# Patient Record
Sex: Female | Born: 1960 | Race: White | Hispanic: No | Marital: Single | Smoking: Current every day smoker
Health system: Southern US, Community
[De-identification: ages and names within clinical notes are randomized; demographics above are authoritative.]

## PROBLEM LIST (undated history)

## (undated) DIAGNOSIS — R002 Palpitations: Secondary | ICD-10-CM

## (undated) DIAGNOSIS — F329 Major depressive disorder, single episode, unspecified: Secondary | ICD-10-CM

## (undated) DIAGNOSIS — L0291 Cutaneous abscess, unspecified: Secondary | ICD-10-CM

## (undated) DIAGNOSIS — F419 Anxiety disorder, unspecified: Secondary | ICD-10-CM

## (undated) DIAGNOSIS — F32A Depression, unspecified: Secondary | ICD-10-CM

## (undated) DIAGNOSIS — J449 Chronic obstructive pulmonary disease, unspecified: Secondary | ICD-10-CM

## (undated) HISTORY — PX: PARTIAL HYSTERECTOMY: SHX80

---

## 2006-02-28 ENCOUNTER — Other Ambulatory Visit: Payer: Self-pay

## 2006-03-01 ENCOUNTER — Inpatient Hospital Stay: Payer: Self-pay | Admitting: Internal Medicine

## 2006-07-06 ENCOUNTER — Emergency Department: Payer: Self-pay | Admitting: Internal Medicine

## 2006-09-15 ENCOUNTER — Emergency Department: Payer: Self-pay | Admitting: Emergency Medicine

## 2006-09-15 ENCOUNTER — Other Ambulatory Visit: Payer: Self-pay

## 2007-04-29 ENCOUNTER — Emergency Department: Payer: Self-pay | Admitting: Emergency Medicine

## 2007-06-03 ENCOUNTER — Emergency Department: Payer: Self-pay | Admitting: Emergency Medicine

## 2007-06-03 ENCOUNTER — Other Ambulatory Visit: Payer: Self-pay

## 2007-10-22 ENCOUNTER — Emergency Department: Payer: Self-pay | Admitting: Emergency Medicine

## 2007-10-24 ENCOUNTER — Emergency Department: Payer: Self-pay | Admitting: Emergency Medicine

## 2008-06-25 ENCOUNTER — Emergency Department: Payer: Self-pay | Admitting: Emergency Medicine

## 2009-11-30 ENCOUNTER — Emergency Department: Payer: Self-pay | Admitting: Emergency Medicine

## 2010-06-20 ENCOUNTER — Emergency Department: Payer: Self-pay | Admitting: Emergency Medicine

## 2011-07-06 ENCOUNTER — Emergency Department: Payer: Self-pay | Admitting: Emergency Medicine

## 2012-05-30 ENCOUNTER — Emergency Department: Payer: Self-pay | Admitting: Internal Medicine

## 2012-09-24 ENCOUNTER — Ambulatory Visit: Payer: Self-pay | Admitting: Internal Medicine

## 2013-08-03 ENCOUNTER — Emergency Department: Payer: Self-pay | Admitting: Emergency Medicine

## 2013-08-03 LAB — RAPID INFLUENZA A&B ANTIGENS

## 2013-10-13 ENCOUNTER — Emergency Department: Payer: Self-pay | Admitting: Emergency Medicine

## 2013-10-13 LAB — URINALYSIS, COMPLETE
BACTERIA: NONE SEEN
BILIRUBIN, UR: NEGATIVE
BLOOD: NEGATIVE
GLUCOSE, UR: NEGATIVE mg/dL (ref 0–75)
Ketone: NEGATIVE
LEUKOCYTE ESTERASE: NEGATIVE
Nitrite: NEGATIVE
Ph: 6 (ref 4.5–8.0)
Protein: NEGATIVE
SPECIFIC GRAVITY: 1.04 (ref 1.003–1.030)
Squamous Epithelial: 1
WBC UR: 1 /HPF (ref 0–5)

## 2013-10-13 LAB — BASIC METABOLIC PANEL
Anion Gap: 3 — ABNORMAL LOW (ref 7–16)
BUN: 8 mg/dL (ref 7–18)
CALCIUM: 9.1 mg/dL (ref 8.5–10.1)
CHLORIDE: 105 mmol/L (ref 98–107)
CO2: 29 mmol/L (ref 21–32)
CREATININE: 0.62 mg/dL (ref 0.60–1.30)
EGFR (African American): >60
GLUCOSE: 95 mg/dL (ref 65–99)
Osmolality: 272 (ref 275–301)
Potassium: 3.8 mmol/L (ref 3.5–5.1)
Sodium: 137 mmol/L (ref 136–145)

## 2013-10-13 LAB — CBC
HCT: 44.8 % (ref 35.0–47.0)
HGB: 14.6 g/dL (ref 12.0–16.0)
MCH: 28.7 pg (ref 26.0–34.0)
MCHC: 32.6 g/dL (ref 32.0–36.0)
MCV: 88 fL (ref 80–100)
PLATELETS: 231 10*3/uL (ref 150–440)
RBC: 5.08 10*6/uL (ref 3.80–5.20)
RDW: 14 % (ref 11.5–14.5)
WBC: 9.6 10*3/uL (ref 3.6–11.0)

## 2013-10-30 ENCOUNTER — Ambulatory Visit: Payer: Self-pay | Admitting: Internal Medicine

## 2014-07-20 ENCOUNTER — Emergency Department: Payer: Self-pay | Admitting: Emergency Medicine

## 2014-11-03 ENCOUNTER — Emergency Department: Payer: Self-pay | Admitting: Emergency Medicine

## 2014-11-03 LAB — BASIC METABOLIC PANEL
ANION GAP: 8 (ref 7–16)
BUN: 7 mg/dL
Calcium, Total: 9.7 mg/dL
Chloride: 102 mmol/L
Co2: 29 mmol/L
Creatinine: 0.52 mg/dL
EGFR (African American): >60
Glucose: 91 mg/dL
Potassium: 3.8 mmol/L
SODIUM: 139 mmol/L

## 2014-11-03 LAB — TROPONIN I: Troponin-I: <0.03 ng/mL

## 2014-11-03 LAB — CBC
HCT: 45.8 % (ref 35.0–47.0)
HGB: 15.1 g/dL (ref 12.0–16.0)
MCH: 29.4 pg (ref 26.0–34.0)
MCHC: 33 g/dL (ref 32.0–36.0)
MCV: 89 fL (ref 80–100)
PLATELETS: 230 10*3/uL (ref 150–440)
RBC: 5.15 10*6/uL (ref 3.80–5.20)
RDW: 14.3 % (ref 11.5–14.5)
WBC: 11.2 10*3/uL — AB (ref 3.6–11.0)

## 2015-08-24 ENCOUNTER — Emergency Department
Admission: EM | Admit: 2015-08-24 | Discharge: 2015-08-24 | Disposition: A | Discharge status: Home / Self Care | Payer: BLUE CROSS/BLUE SHIELD | Attending: Emergency Medicine | Admitting: Emergency Medicine

## 2015-08-24 ENCOUNTER — Encounter: Payer: Self-pay | Admitting: Emergency Medicine

## 2015-08-24 DIAGNOSIS — L0291 Cutaneous abscess, unspecified: Secondary | ICD-10-CM

## 2015-08-24 DIAGNOSIS — F17200 Nicotine dependence, unspecified, uncomplicated: Secondary | ICD-10-CM | POA: Diagnosis not present

## 2015-08-24 DIAGNOSIS — L02411 Cutaneous abscess of right axilla: Secondary | ICD-10-CM | POA: Diagnosis present

## 2015-08-24 HISTORY — DX: Cutaneous abscess, unspecified: L02.91

## 2015-08-24 MED ORDER — DOXYCYCLINE HYCLATE 100 MG PO TABS
ORAL_TABLET | ORAL | Status: AC
  Administered 2015-08-24: 100 mg via ORAL
  Filled 2015-08-24: qty 1

## 2015-08-24 MED ORDER — LIDOCAINE-EPINEPHRINE (PF) 1 %-1:200000 IJ SOLN
INTRAMUSCULAR | Status: AC
  Filled 2015-08-24: qty 30

## 2015-08-24 MED ORDER — HYDROCODONE-ACETAMINOPHEN 5-325 MG PO TABS
1.0000 | ORAL_TABLET | ORAL | Status: DC | PRN
Start: 2015-08-24 — End: 2018-04-05

## 2015-08-24 MED ORDER — DOXYCYCLINE HYCLATE 100 MG PO TABS
100.0000 mg | ORAL_TABLET | Freq: Once | ORAL | Status: AC
  Administered 2015-08-24: 100 mg via ORAL

## 2015-08-24 MED ORDER — ONDANSETRON 4 MG PO TBDP
4.0000 mg | ORAL_TABLET | Freq: Once | ORAL | Status: AC
  Administered 2015-08-24: 4 mg via ORAL

## 2015-08-24 MED ORDER — OXYCODONE-ACETAMINOPHEN 5-325 MG PO TABS
ORAL_TABLET | ORAL | Status: AC
  Administered 2015-08-24: 2 via ORAL
  Filled 2015-08-24: qty 2

## 2015-08-24 MED ORDER — ONDANSETRON 4 MG PO TBDP
ORAL_TABLET | ORAL | Status: AC
  Filled 2015-08-24: qty 1

## 2015-08-24 MED ORDER — DOXYCYCLINE HYCLATE 100 MG PO TABS: 100.0000 mg | ORAL_TABLET | Freq: Two times a day (BID) | ORAL | Status: DC

## 2015-08-24 MED ORDER — OXYCODONE-ACETAMINOPHEN 5-325 MG PO TABS
2.0000 | ORAL_TABLET | Freq: Once | ORAL | Status: AC
  Administered 2015-08-24: 2 via ORAL

## 2015-08-24 NOTE — ED Notes (Signed)
Abscess drained by md.  Pt tolerated well.   Bandage applied to right axilla.  Family with pt.  D/c inst to pt.

## 2015-08-24 NOTE — ED Provider Notes (Signed)
[facility] Emergency Department Provider Note  Time seen: 8:47 PM  I have reviewed the triage vital signs and the nursing notes.   HISTORY  Chief Complaint Abscess    HPI Taylor Larsen is a 55 y.o. female with a past medical history of abscesses presents to the emergency department with multiple abscesses to her right axilla. According to the patient she was developing abscesses, she took a course of Bactrim which completed approximately 2 weeks ago, but states since completion of the antibiotic she has continued to develop abscesses to her right axilla now they have become enlarged, red and painful. Denies fever, denies nausea and vomiting. States a history of several abscesses in the past. One of which was on the groin all the others have been in the axilla. Describes the pain as moderate, dull/aching.     Past Medical History  Diagnosis Date  . Abscess     There are no active problems to display for this patient.   Past Surgical History  Procedure Laterality Date  . Cesarean section    . Partial hysterectomy      No current outpatient prescriptions on file.  Allergies Review of patient's allergies indicates no known allergies.  History reviewed. No pertinent family history.  Social History Social History  Substance Use Topics  . Smoking status: Current Every Day Smoker  . Smokeless tobacco: None  . Alcohol Use: No    Review of Systems Constitutional: Negative for fever. Cardiovascular: Negative for chest pain. Respiratory: Negative for shortness of breath. Gastrointestinal: Negative for abdominal pain Musculoskeletal: Right axilla pain. Neurological: Negative for headache 10-point ROS otherwise negative.  ____________________________________________   PHYSICAL EXAM:  VITAL SIGNS: ED Triage Vitals  Enc Vitals Group     BP 08/24/15 1706 143/75 mmHg     Pulse Rate 08/24/15 1706 78     Resp 08/24/15 1706 16     Temp 08/24/15  1706 98.1 F (36.7 C)     Temp Source 08/24/15 1706 Oral     SpO2 08/24/15 1706 100 %     Weight 08/24/15 1706 85 lb (38.556 kg)     Height 08/24/15 1706 5' (1.524 m)     Head Cir --      Peak Flow --      Pain Score 08/24/15 1658 10     Pain Loc --      Pain Edu? --      Excl. in GC? --     Constitutional: Alert and oriented. Well appearing and in no distress. Eyes: Normal exam ENT   Head: Normocephalic and atraumatic   Mouth/Throat: Mucous membranes are moist. Cardiovascular: Normal rate, regular rhythm. No murmur Respiratory: Normal respiratory effort without tachypnea nor retractions. Breath sounds are clear Gastrointestinal: Soft and nontender. No distention.   Musculoskeletal: 3 areas tender and swollen in the right axilla consistent with abscess, one of which is spontaneously draining. Erythema and warmth to the area. Neurologic:  Normal speech and language. No gross focal neurologic deficits Skin:  Skin is warm, dry and intact.  Psychiatric: Mood and affect are normal. Speech and behavior are normal.   ____________________________________________    INITIAL IMPRESSION / ASSESSMENT AND PLAN / ED COURSE  Pertinent labs & imaging results that were available during my care of the patient were reviewed by me and considered in my medical decision making (see chart for details).  Patient with an exam consistent with several small abscesses to the right axilla, one of  which is spontaneously draining, 2 will likely require incision and drainage. No fever, no vomiting, otherwise uncomplicated. Given the patient's history of MRSA in the past and recent course of Bactrim without response we will place the patient on doxycycline after appropriate incision and drainage and have her follow up with her primary care physician.  INCISION AND DRAINAGE Performed by: Minna Antis Consent: Verbal consent obtained. Risks and benefits: risks, benefits and alternatives were  discussed Type: abscess  Body area: Right axilla  Anesthesia: local infiltration  Incision was made with a scalpel.  Local anesthetic: lidocaine 1 % with epinephrine  Anesthetic total: 3 ml  Complexity: complex Blunt dissection to break up loculations  Drainage: purulent  Drainage amount: 3-4 cc    Patient tolerance: Patient tolerated the procedure well with no immediate complications.  Incision and drainage completed. We'll place the patient on doxycycline twice a day for an extended never follow up with her primary care physician in 2 days for recheck.   ____________________________________________   FINAL CLINICAL IMPRESSION(S) / ED DIAGNOSES  Abscess to right axilla   Minna Antis, MD 08/24/15 2204

## 2015-08-24 NOTE — Discharge Instructions (Signed)
Please keep area covered with gauze. Please follow-up with [facility] clinic in 2 days for recheck/reevaluation. Return to the emergency department for any worsening pain, fever, or any other symptoms [place] concerning to yourself.   Abscess An abscess is an infected area that contains a collection of pus and debris.It can occur in almost any part of the body. An abscess is also known as a furuncle or boil. CAUSES  An abscess occurs when tissue gets infected. This can occur from blockage of oil or sweat glands, infection of hair follicles, or a minor injury to the skin. As the body tries to fight the infection, pus collects in the area and creates pressure under the skin. This pressure causes pain. People with weakened immune systems have difficulty fighting infections and get certain abscesses more often.  SYMPTOMS Usually an abscess develops on the skin and becomes a painful mass that is red, warm, and tender. If the abscess forms under the skin, you may feel a moveable soft area under the skin. Some abscesses break open (rupture) on their own, but most will continue to get worse without care. The infection can spread deeper into the body and eventually into the bloodstream, causing you to feel ill.  DIAGNOSIS  Your caregiver will take your medical history and perform a physical exam. A sample of fluid may also be taken from the abscess to determine what is causing your infection. TREATMENT  Your caregiver may prescribe antibiotic medicines to fight the infection. However, taking antibiotics alone usually does not cure an abscess. Your caregiver may need to make a small cut (incision) in the abscess to drain the pus. In some cases, gauze is packed into the abscess to reduce pain and to continue draining the area. HOME CARE INSTRUCTIONS   Only take over-the-counter or prescription medicines for pain, discomfort, or fever as directed by your caregiver.  If you were prescribed antibiotics, take them  as directed. Finish them even if you start to feel better.  If gauze is used, follow your caregiver's directions for changing the gauze.  To avoid spreading the infection:  Keep your draining abscess covered with a bandage.  Wash your hands well.  Do not share personal care items, towels, or whirlpools with others.  Avoid skin contact with others.  Keep your skin and clothes clean around the abscess.  Keep all follow-up appointments as directed by your caregiver. SEEK MEDICAL CARE IF:   You have increased pain, swelling, redness, fluid drainage, or bleeding.  You have muscle aches, chills, or a general ill feeling.  You have a fever. MAKE SURE YOU:   Understand these instructions.  Will watch your condition.  Will get help right away if you are not doing well or get worse.   This information is not intended to replace advice given to you by your health care provider. Make sure you discuss any questions you have with your health care provider.   Document Released: 05/03/2005 Document Revised: 01/23/2012 Document Reviewed: 10/06/2011 Elsevier Interactive Patient Education 2016 [employer]  Incision and Drainage Incision and drainage is a procedure in which a sac-like structure (cystic structure) is opened and drained. The area to be drained usually contains material such as pus, fluid, or blood.  LET YOUR CAREGIVER KNOW ABOUT:   Allergies to medicine.  Medicines taken, including vitamins, herbs, eyedrops, over-the-counter medicines, and creams.  Use of steroids (by mouth or creams).  Previous problems with anesthetics or numbing medicines.  History of bleeding problems or  blood clots.  Previous surgery.  Other health problems, including diabetes and kidney problems.  Possibility of pregnancy, if this applies. RISKS AND COMPLICATIONS  Pain.  Bleeding.  Scarring.  Infection. BEFORE THE PROCEDURE  You may need to have an ultrasound or other imaging  tests to see how large or deep your cystic structure is. Blood tests may also be used to determine if you have an infection or how severe the infection is. You may need to have a tetanus shot. PROCEDURE  The affected area is cleaned with a cleaning fluid. The cyst area will then be numbed with a medicine (local anesthetic). A small incision will be made in the cystic structure. A syringe or catheter may be used to drain the contents of the cystic structure, or the contents may be squeezed out. The area will then be flushed with a cleansing solution. After cleansing the area, it is often gently packed with a gauze or another wound dressing. Once it is packed, it will be covered with gauze and tape or some other type of wound dressing. AFTER THE PROCEDURE   Often, you will be allowed to go home right after the procedure.  You may be given antibiotic medicine to prevent or heal an infection.  If the area was packed with gauze or some other wound dressing, you will likely need to come back in 1 to 2 days to get it removed.  The area should heal in about 14 days.   This information is not intended to replace advice given to you by your health care provider. Make sure you discuss any questions you have with your health care provider.   Document Released: 01/17/2001 Document Revised: 01/23/2012 Document Reviewed: 09/18/2011 Elsevier Interactive Patient Education [employer]

## 2015-08-24 NOTE — ED Notes (Signed)
Pt has MRSA and 2 weeks ago just finished abx for abscess.  Multiple abscess to right axilla.  Has had fevers at home.

## 2015-11-28 ENCOUNTER — Observation Stay
Admission: EM | Admit: 2015-11-28 | Discharge: 2015-11-30 | Disposition: A | Discharge status: Home / Self Care | Payer: BLUE CROSS/BLUE SHIELD | Attending: Specialist | Admitting: Specialist

## 2015-11-28 ENCOUNTER — Encounter: Payer: Self-pay | Admitting: Internal Medicine

## 2015-11-28 ENCOUNTER — Emergency Department: Payer: BLUE CROSS/BLUE SHIELD

## 2015-11-28 DIAGNOSIS — Z8249 Family history of ischemic heart disease and other diseases of the circulatory system: Secondary | ICD-10-CM | POA: Diagnosis not present

## 2015-11-28 DIAGNOSIS — J449 Chronic obstructive pulmonary disease, unspecified: Secondary | ICD-10-CM | POA: Insufficient documentation

## 2015-11-28 DIAGNOSIS — A4902 Methicillin resistant Staphylococcus aureus infection, unspecified site: Secondary | ICD-10-CM | POA: Insufficient documentation

## 2015-11-28 DIAGNOSIS — M7989 Other specified soft tissue disorders: Secondary | ICD-10-CM | POA: Insufficient documentation

## 2015-11-28 DIAGNOSIS — I10 Essential (primary) hypertension: Secondary | ICD-10-CM | POA: Diagnosis not present

## 2015-11-28 DIAGNOSIS — K219 Gastro-esophageal reflux disease without esophagitis: Secondary | ICD-10-CM | POA: Insufficient documentation

## 2015-11-28 DIAGNOSIS — L03114 Cellulitis of left upper limb: Secondary | ICD-10-CM | POA: Diagnosis present

## 2015-11-28 DIAGNOSIS — Z8614 Personal history of Methicillin resistant Staphylococcus aureus infection: Secondary | ICD-10-CM | POA: Insufficient documentation

## 2015-11-28 DIAGNOSIS — F17200 Nicotine dependence, unspecified, uncomplicated: Secondary | ICD-10-CM | POA: Insufficient documentation

## 2015-11-28 DIAGNOSIS — F329 Major depressive disorder, single episode, unspecified: Secondary | ICD-10-CM | POA: Diagnosis not present

## 2015-11-28 DIAGNOSIS — Z800 Family history of malignant neoplasm of digestive organs: Secondary | ICD-10-CM | POA: Insufficient documentation

## 2015-11-28 DIAGNOSIS — T63301A Toxic effect of unspecified spider venom, accidental (unintentional), initial encounter: Secondary | ICD-10-CM | POA: Insufficient documentation

## 2015-11-28 DIAGNOSIS — M79642 Pain in left hand: Secondary | ICD-10-CM | POA: Diagnosis not present

## 2015-11-28 DIAGNOSIS — R002 Palpitations: Secondary | ICD-10-CM | POA: Diagnosis not present

## 2015-11-28 DIAGNOSIS — F411 Generalized anxiety disorder: Secondary | ICD-10-CM | POA: Insufficient documentation

## 2015-11-28 DIAGNOSIS — Z90710 Acquired absence of both cervix and uterus: Secondary | ICD-10-CM | POA: Diagnosis not present

## 2015-11-28 DIAGNOSIS — L0390 Cellulitis, unspecified: Secondary | ICD-10-CM

## 2015-11-28 HISTORY — DX: Palpitations: R00.2

## 2015-11-28 HISTORY — DX: Major depressive disorder, single episode, unspecified: F32.9

## 2015-11-28 HISTORY — DX: Depression, unspecified: F32.A

## 2015-11-28 HISTORY — DX: Anxiety disorder, unspecified: F41.9

## 2015-11-28 HISTORY — DX: Chronic obstructive pulmonary disease, unspecified: J44.9

## 2015-11-28 LAB — CBC WITH DIFFERENTIAL/PLATELET
Basophils Absolute: 0 10*3/uL (ref 0–0.1)
Basophils Relative: 0 %
Eosinophils Absolute: 0.1 10*3/uL (ref 0–0.7)
Eosinophils Relative: 0 %
HEMATOCRIT: 42.6 % (ref 35.0–47.0)
HEMOGLOBIN: 14.6 g/dL (ref 12.0–16.0)
LYMPHS ABS: 1.5 10*3/uL (ref 1.0–3.6)
LYMPHS PCT: 10 %
MCH: 28.9 pg (ref 26.0–34.0)
MCHC: 34.1 g/dL (ref 32.0–36.0)
MCV: 84.6 fL (ref 80.0–100.0)
MONOS PCT: 6 %
Monocytes Absolute: 0.8 10*3/uL (ref 0.2–0.9)
Neutro Abs: 12.8 10*3/uL — ABNORMAL HIGH (ref 1.4–6.5)
Neutrophils Relative %: 84 %
Platelets: 237 10*3/uL (ref 150–440)
RBC: 5.04 MIL/uL (ref 3.80–5.20)
RDW: 14.4 % (ref 11.5–14.5)
WBC: 15.2 10*3/uL — AB (ref 3.6–11.0)

## 2015-11-28 LAB — BASIC METABOLIC PANEL
Anion gap: 9 (ref 5–15)
BUN: 8 mg/dL (ref 6–20)
CHLORIDE: 106 mmol/L (ref 101–111)
CO2: 25 mmol/L (ref 22–32)
Calcium: 9.4 mg/dL (ref 8.9–10.3)
Creatinine, Ser: 0.6 mg/dL (ref 0.44–1.00)
GFR calc Af Amer: >60 mL/min (ref >60)
GFR calc non Af Amer: >60 mL/min (ref >60)
GLUCOSE: 121 mg/dL — AB (ref 65–99)
POTASSIUM: 3.9 mmol/L (ref 3.5–5.1)
Sodium: 140 mmol/L (ref 135–145)

## 2015-11-28 LAB — MRSA PCR SCREENING: MRSA BY PCR: NEGATIVE

## 2015-11-28 LAB — SEDIMENTATION RATE: SED RATE: 20 mm/h (ref 0–30)

## 2015-11-28 MED ORDER — MORPHINE SULFATE (PF) 2 MG/ML IV SOLN
2.0000 mg | INTRAVENOUS | Status: DC | PRN
  Administered 2015-11-28 – 2015-11-30 (×5): 2 mg via INTRAVENOUS
  Filled 2015-11-28 (×5): qty 1

## 2015-11-28 MED ORDER — ACETAMINOPHEN 650 MG RE SUPP: 650.0000 mg | Freq: Four times a day (QID) | RECTAL | Status: DC | PRN

## 2015-11-28 MED ORDER — ALPRAZOLAM 0.25 MG PO TABS
0.2500 mg | ORAL_TABLET | ORAL | Status: DC | PRN
  Administered 2015-11-28 – 2015-11-29 (×6): 0.25 mg via ORAL
  Filled 2015-11-28 (×6): qty 1

## 2015-11-28 MED ORDER — VANCOMYCIN HCL 500 MG IV SOLR
500.0000 mg | Freq: Two times a day (BID) | INTRAVENOUS | Status: DC
  Administered 2015-11-29 (×3): 500 mg via INTRAVENOUS
  Filled 2015-11-28 (×6): qty 500

## 2015-11-28 MED ORDER — ACETAMINOPHEN 325 MG PO TABS: 650.0000 mg | ORAL_TABLET | Freq: Four times a day (QID) | ORAL | Status: DC | PRN

## 2015-11-28 MED ORDER — PIPERACILLIN-TAZOBACTAM 3.375 G IVPB
3.3750 g | Freq: Three times a day (TID) | INTRAVENOUS | Status: DC
  Administered 2015-11-28 – 2015-11-30 (×6): 3.375 g via INTRAVENOUS
  Filled 2015-11-28 (×8): qty 50

## 2015-11-28 MED ORDER — BISACODYL 10 MG RE SUPP: 10.0000 mg | Freq: Every day | RECTAL | Status: DC | PRN

## 2015-11-28 MED ORDER — ONDANSETRON HCL 4 MG PO TABS: 4.0000 mg | ORAL_TABLET | Freq: Four times a day (QID) | ORAL | Status: DC | PRN

## 2015-11-28 MED ORDER — PANTOPRAZOLE SODIUM 40 MG PO TBEC
40.0000 mg | DELAYED_RELEASE_TABLET | Freq: Every day | ORAL | Status: DC
  Administered 2015-11-28 – 2015-11-30 (×3): 40 mg via ORAL
  Filled 2015-11-28 (×3): qty 1

## 2015-11-28 MED ORDER — SODIUM CHLORIDE 0.9 % IV SOLN
INTRAVENOUS | Status: DC
  Administered 2015-11-28 (×2): via INTRAVENOUS

## 2015-11-28 MED ORDER — DOCUSATE SODIUM 100 MG PO CAPS
100.0000 mg | ORAL_CAPSULE | Freq: Two times a day (BID) | ORAL | Status: DC
  Administered 2015-11-28 – 2015-11-30 (×4): 100 mg via ORAL
  Filled 2015-11-28 (×4): qty 1

## 2015-11-28 MED ORDER — HEPARIN SODIUM (PORCINE) 5000 UNIT/ML IJ SOLN
5000.0000 [IU] | Freq: Three times a day (TID) | INTRAMUSCULAR | Status: DC
  Administered 2015-11-28 – 2015-11-29 (×2): 5000 [IU] via SUBCUTANEOUS
  Filled 2015-11-28 (×2): qty 1

## 2015-11-28 MED ORDER — HYDROCODONE-ACETAMINOPHEN 5-325 MG PO TABS
1.0000 | ORAL_TABLET | ORAL | Status: DC | PRN
  Administered 2015-11-28 – 2015-11-30 (×8): 1 via ORAL
  Filled 2015-11-28 (×8): qty 1

## 2015-11-28 MED ORDER — LORAZEPAM 2 MG/ML IJ SOLN
1.0000 mg | Freq: Once | INTRAMUSCULAR | Status: AC
  Administered 2015-11-28: 1 mg via INTRAVENOUS
  Filled 2015-11-28: qty 1

## 2015-11-28 MED ORDER — VANCOMYCIN HCL IN DEXTROSE 1-5 GM/200ML-% IV SOLN
1000.0000 mg | Freq: Once | INTRAVENOUS | Status: AC
  Administered 2015-11-28: 1000 mg via INTRAVENOUS
  Filled 2015-11-28: qty 200

## 2015-11-28 MED ORDER — ONDANSETRON HCL 4 MG/2ML IJ SOLN: 4.0000 mg | Freq: Four times a day (QID) | INTRAMUSCULAR | Status: DC | PRN

## 2015-11-28 NOTE — Consult Note (Signed)
ORTHOPAEDIC CONSULTATION  REQUESTING PHYSICIAN: Marguarite ArbourJeffrey D Sparks, MD  Chief Complaint:   Left dorsal hand pain, erythema, and swelling.  History of Present Illness: Taylor Larsen is a 55 y.o. female with a history of multiple skin abscesses, COPD, anxiety, and heart palpitations who was doing some yard work yesterday when she felt a sharp stinging on the dorsal aspect of her left index finger which she attributes to a spider bite. She awoke this morning with significant pain in the dorsal aspect of the index finger and hand with increased swelling and erythema extending into the dorsal hand region. The patient notes that her son had developed cellulitis of his right index finger several years ago resulting in an amputation of his finger, which has her quite concerned. The patient also has a history of multiple subcutaneous abscesses in her axilla and groin regions which she treats with heat compresses. She has been diagnosed with MRSA in the past.  Past Medical History  Diagnosis Date  . Abscess   . COPD (chronic obstructive pulmonary disease) (HCC)   . Anxiety   . Heart palpitations    Past Surgical History  Procedure Laterality Date  . Cesarean section    . Partial hysterectomy     Social History   Social History  . Marital Status: Single    Spouse Name: N/A  . Number of Children: N/A  . Years of Education: N/A   Social History Main Topics  . Smoking status: Current Every Day Smoker  . Smokeless tobacco: None  . Alcohol Use: No  . Drug Use: No  . Sexual Activity: Not Asked   Other Topics Concern  . None   Social History Narrative   Family History  Problem Relation Age of Onset  . CAD Mother   . Rectal cancer Mother   . CAD Father   . CAD Sister    No Known Allergies Prior to Admission medications   Medication Sig Start Date End Date Taking? Authorizing Provider  doxycycline (VIBRA-TABS) 100 MG tablet  Take 1 tablet (100 mg total) by mouth 2 (two) times daily. 08/24/15   Minna AntisKevin Paduchowski, MD  HYDROcodone-acetaminophen (NORCO/VICODIN) 5-325 MG tablet Take 1 tablet by mouth every 4 (four) hours as needed for moderate pain. 08/24/15   Minna AntisKevin Paduchowski, MD   Dg Hand 2 View Left  11/28/2015  CLINICAL DATA:  Pt reports feeling a sting when doing yard work a few days ago, blister noted to left dorsal hand, pt presents to ED today with redness and swelling, concern for cellulitis. EXAM: LEFT HAND - 2 VIEW COMPARISON:  None. FINDINGS: No evidence of fracture of the carpal or metacarpal bones. Radiocarpal joint is intact. Phalanges are normal. No soft tissue injury. No foreign body IMPRESSION: No fracture or  foreign body. Electronically Signed   By: Genevive BiStewart  Edmunds M.D.   On: 11/28/2015 11:45    Positive ROS: All other systems have been reviewed and were otherwise negative with the exception of those mentioned in the HPI and as above.  Physical Exam: General:  Alert, no acute distress Psychiatric:  Patient is competent for consent with normal mood and affect   Cardiovascular:  No pedal edema Respiratory:  No wheezing, non-labored breathing GI:  Abdomen is soft and non-tender Skin:  No lesions in the area of chief complaint Neurologic:  Sensation intact distally Lymphatic:  No axillary or cervical lymphadenopathy  Orthopedic Exam:  Orthopedic examination is limited to the left hand and forearm. There is a  1.2-1.5 cm area of blistering/superficial necrosis over the dorsal aspect of the left index finger proximal phalanx midway between the MCP and PIP joints. She has extensive swelling and erythema on the dorsal aspect of her hand extending from the index PIP joint distally to the [facility] joint region of her dorsal radial hand proximally. She has moderate tenderness to palpation throughout this area as well. Palmarly, there is no erythema and only mild swelling, especially along the flexor sheath. She does  have diffuse tenderness to palpation over both the dorsal and volar aspects of the hand, and complains of significant pain with any attempted active or passive motion of the index finger. However, it is difficult to determine how much of this is due to apprehension and pain from a dorsal hand and finger source versus an actual flexor tenosynovitis. She is neurovascularly intact to all digits. She has full active and passive range of motion of the wrist. There are no skin abnormalities over the wrist or forearm.  X-rays:  X-rays of the left hand are available for review. These films demonstrate no evidence for fractures, lytic lesions, significant degenerative changes, or any retained foreign bodies.  Assessment: Left dorsal hand cellulitis status post spider bite to the dorsal aspect of left index finger.  Plan: I agree with the plan to unroof the area of blistering where the spider bite occurred on the dorsal aspect of the index finger. I also agree with the plan to admit the patient for close observation and IV antibiotics. Given her history of MRSA, I do feel that vancomycin is an appropriate choice of antibiotics. At this point, I do not feel that she has a flexor tenosynovitis, but do feel that close follow-up is warranted to be sure that she does not develop this. If so, she will need to be taken to the operating room for formal irrigation and debridement.  Thank you for asking me to participate in the care of this pleasant unfortunate woman. I will be happy to follow her with you.   Maryagnes Amos, MD  Beeper #:  [phone]  11/28/2015 3:12 PM

## 2015-11-28 NOTE — H&P (Signed)
History and Physical    Taylor Larsen JYN:[id number] DOB: 04/09/1961 DOA: 11/28/2015  Referring physician: Dr. Pershing ProudSchaevitz PCP: No PCP Per Patient  Specialists: none  Chief Complaint: left hand pain  HPI: Taylor Larsen is a 55 y.o. female has a past medical history significant for COPD/tobacco use and anxiety now with acute left hand pain with swelling and redness. Noted a "bite" to left hand/finger yesterday which has progressively become more swollen, red, and tender. Presented to ER where WBC=15K. Left hand severely tender and red with some purulence to 2nd finger. She is afebrile. No N/V/D. Denies CP or SOB. She is now admitted.  Review of Systems: The patient denies anorexia, fever, weight loss,, vision loss, decreased hearing, hoarseness, chest pain, syncope, dyspnea on exertion, peripheral edema, balance deficits, hemoptysis, abdominal pain, melena, hematochezia, severe indigestion/heartburn, hematuria, incontinence, genital sores, muscle weakness, transient blindness, difficulty walking, depression, unusual weight change, abnormal bleeding, enlarged lymph nodes, angioedema, and breast masses.   Past Medical History  Diagnosis Date  . Abscess   . COPD (chronic obstructive pulmonary disease) (HCC)   . Anxiety   . Heart palpitations    Past Surgical History  Procedure Laterality Date  . Cesarean section    . Partial hysterectomy     Social History:  reports that she has been smoking.  She does not have any smokeless tobacco history on file. She reports that she does not drink alcohol or use illicit drugs.  No Known Allergies  Family History  Problem Relation Age of Onset  . CAD Mother   . Rectal cancer Mother   . CAD Father   . CAD Sister     Prior to Admission medications   Medication Sig Start Date End Date Taking? Authorizing Provider  doxycycline (VIBRA-TABS) 100 MG tablet Take 1 tablet (100 mg total) by mouth 2 (two) times daily. 08/24/15   Minna AntisKevin Paduchowski, MD   HYDROcodone-acetaminophen (NORCO/VICODIN) 5-325 MG tablet Take 1 tablet by mouth every 4 (four) hours as needed for moderate pain. 08/24/15   Minna AntisKevin Paduchowski, MD   Physical Exam: Filed Vitals:   11/28/15 1015  BP: 161/70  Pulse: 81  Temp: 97.4 F (36.3 C)  TempSrc: Oral  Resp: 16  Height: 5' (1.524 m)  Weight: 44.906 kg (99 lb)  SpO2: 100%     General:  No apparent distress, WDWN, [place]/AT  Eyes: PERRL, EOMI, no scleral icterus, conjunctiva clear  ENT: moist oropharynx without exudates, TM's benign, dentition fair  Neck: supple, no lymphadenopathy. No bruits or thyromegaly  Cardiovascular: regular rate without MRG; 2+ peripheral pulses, no JVD, no peripheral edema  Respiratory: CTA biL, good air movement without wheezing, rhonchi or crackled, respiratory effort normal  Abdomen: soft, non tender to palpation, positive bowel sounds, no guarding, no rebound, no organomegaly  Skin: 3/4 of left hand is swollen and red with tenderness to palpation. 1cm purulent area noted to left 2nd finger.  Musculoskeletal: normal bulk and tone, no joint swelling  Psychiatric: normal mood and affect, A&OX3  Neurologic: CN 2-12 grossly intact, Motor strength 5/5 in all 4 groups with symmetric DTR's and non-focal sensory exam  Labs on Admission:  Basic Metabolic Panel:  Recent Labs Lab 11/28/15 1142  NA 140  K 3.9  CL 106  CO2 25  GLUCOSE 121*  BUN 8  CREATININE 0.60  CALCIUM 9.4   Liver Function Tests: No results for input(s): AST, ALT, ALKPHOS, BILITOT, PROT, ALBUMIN in the last 168 hours. No results for  input(s): LIPASE, AMYLASE in the last 168 hours. No results for input(s): AMMONIA in the last 168 hours. CBC:  Recent Labs Lab 11/28/15 1142  WBC 15.2*  NEUTROABS 12.8*  HGB 14.6  HCT 42.6  MCV 84.6  PLT 237   Cardiac Enzymes: No results for input(s): CKTOTAL, CKMB, CKMBINDEX, TROPONINI in the last 168 hours.  BNP (last 3 results) No results for input(s): BNP in  the last 8760 hours.  ProBNP (last 3 results) No results for input(s): PROBNP in the last 8760 hours.  CBG: No results for input(s): GLUCAP in the last 168 hours.  Radiological Exams on Admission: Dg Hand 2 View Left  11/28/2015  CLINICAL DATA:  Pt reports feeling a sting when doing yard work a few days ago, blister noted to left dorsal hand, pt presents to ED today with redness and swelling, concern for cellulitis. EXAM: LEFT HAND - 2 VIEW COMPARISON:  None. FINDINGS: No evidence of fracture of the carpal or metacarpal bones. Radiocarpal joint is intact. Phalanges are normal. No soft tissue injury. No foreign body IMPRESSION: No fracture or  foreign body. Electronically Signed   By: Genevive Bi M.D.   On: 11/28/2015 11:45    EKG: Independently reviewed.  Assessment/Plan Principal Problem:   Cellulitis Active Problems:   Left hand pain   HTN (hypertension)   GAD (generalized anxiety disorder)   Will observe on floor with IV fluids and IV ABX. Cultures sent. Consult Ortho. Xanax as needed. Pt defers nicotine patch. Repeat labs in AM  Diet: low salt Fluids: NS@100  DVT Prophylaxis: SQ Heparin  Code Status: FULL  Family Communication: yes  Disposition Plan: home  Time spent: 50 min

## 2015-11-28 NOTE — Progress Notes (Signed)
Pharmacy Antibiotic Note  Taylor Larsen is a 55 y.o. female admitted on 11/28/2015 with cellulitis.  Pharmacy has been consulted for vancomycin dosing. Patient also ordered Zosyn.   Plan: Vancomycin 1000 mg iv once given in ED. Will continue with vancomycin 500 IV every 12 hours and order a trough with the 5th dose.  Goal trough 15-20 mcg/mL until r/o abscess/osteo.  Height: 5' (152.4 cm) Weight: 99 lb (44.906 kg) IBW/kg (Calculated) : 45.5  Temp (24hrs), Avg:97.4 F (36.3 C), Min:97.4 F (36.3 C), Max:97.4 F (36.3 C)   Recent Labs Lab 11/28/15 1142  WBC 15.2*  CREATININE 0.60    Estimated Creatinine Clearance: 58.3 mL/min (by C-G formula based on Cr of 0.6).    No Known Allergies  Antimicrobials this admission: vancomycin 4/23 >>  Zosyn 4/23 >>   Dose adjustments this admission:   Microbiology results: 4/23 BCx: pending Wound cx: pending  Thank you for allowing pharmacy to be a part of this patient's care.  Valentina GuChristy, Emeric Novinger D 11/28/2015 2:31 PM

## 2015-11-28 NOTE — ED Provider Notes (Signed)
[facility] Emergency Department Provider Note  ____________________________________________  Time seen: Approximately 11:10 AM  I have reviewed the triage vital signs and the nursing notes.   HISTORY  Chief Complaint Cellulitis   HPI Taylor Larsen is a 55 y.o. female with a history of MRSA was presenting today with 1 day of left index finger swelling and pain. She says that she thinks she had a bug bite several days ago and then yesterday the swelling began. She is also noticed some pus in the dorsum of her left index finger. It is painful with movement. She did not witness any bug biting her but thinks that is the cause of the initial lesion. She says that the initial lesion on the dorsum of her left index finger was itchy and blood filled. She said that the blood drained and was replaced with pus. Denies any fever.   Past Medical History  Diagnosis Date  . Abscess     There are no active problems to display for this patient.   Past Surgical History  Procedure Laterality Date  . Cesarean section    . Partial hysterectomy      Current Outpatient Rx  Name  Route  Sig  Dispense  Refill  . doxycycline (VIBRA-TABS) 100 MG tablet   Oral   Take 1 tablet (100 mg total) by mouth 2 (two) times daily.   20 tablet   0   . HYDROcodone-acetaminophen (NORCO/VICODIN) 5-325 MG tablet   Oral   Take 1 tablet by mouth every 4 (four) hours as needed for moderate pain.   10 tablet   0     Allergies Review of patient's allergies indicates no known allergies.  No family history on file.  Social History Social History  Substance Use Topics  . Smoking status: Current Every Day Smoker  . Smokeless tobacco: Not on file  . Alcohol Use: No    Review of Systems Constitutional: No fever/chills Eyes: No visual changes. ENT: No sore throat. Cardiovascular: Denies chest pain. Respiratory: Denies shortness of breath. Gastrointestinal: No abdominal pain.  No  nausea, no vomiting.  No diarrhea.  No constipation. Genitourinary: Negative for dysuria. Musculoskeletal: Negative for back pain. Skin: Negative for rash. Neurological: Negative for headaches, focal weakness or numbness.  10-point ROS otherwise negative.  ____________________________________________   PHYSICAL EXAM:  VITAL SIGNS: ED Triage Vitals  Enc Vitals Group     BP 11/28/15 1015 161/70 mmHg     Pulse Rate 11/28/15 1015 81     Resp 11/28/15 1015 16     Temp 11/28/15 1015 97.4 F (36.3 C)     Temp Source 11/28/15 1015 Oral     SpO2 11/28/15 1015 100 %     Weight 11/28/15 1015 99 lb (44.906 kg)     Height 11/28/15 1015 5' (1.524 m)     Head Cir --      Peak Flow --      Pain Score 11/28/15 1021 8     Pain Loc --      Pain Edu? --      Excl. in GC? --     Constitutional: Alert and oriented. Well appearing and in no acute distress. Eyes: Conjunctivae are normal. PERRL. EOMI. Head: Atraumatic. Nose: No congestion/rhinnorhea. Mouth/Throat: Mucous membranes are moist.   Neck: No stridor.   Cardiovascular: Normal rate, regular rhythm. Grossly normal heart sounds.  Respiratory: Normal respiratory effort.  No retractions. Lungs CTAB. Gastrointestinal: Soft and nontender. No distention.  Musculoskeletal: Left hand with erythema over the dorsum covering about one third of the dorsum extending from the index finger. There is moderate tenderness over the erythematous area but without any induration or pus. The left index fingers concentrically swollen with tenderness on passive extension as well as with palpation over the flexor tendons. Over the dorsum of the proximal segment there is a 1 cm, pus filled lesion which is tender to palpation. It is not actively draining. Neurologic:  Normal speech and language. No gross focal neurologic deficits are appreciated. No gait instability. Skin:  Skin is warm, dry and intact. No rash noted. Psychiatric: Mood and affect are normal. Speech  and behavior are normal.  ____________________________________________   LABS (all labs ordered are listed, but only abnormal results are displayed)  Labs Reviewed  CBC WITH DIFFERENTIAL/PLATELET - Abnormal; Notable for the following:    WBC 15.2 (*)    Neutro Abs 12.8 (*)    All other components within normal limits  BASIC METABOLIC PANEL - Abnormal; Notable for the following:    Glucose, Bld 121 (*)    All other components within normal limits  SEDIMENTATION RATE   ____________________________________________  EKG   ____________________________________________  RADIOLOGY   IMPRESSION: No fracture or foreign body.   Electronically Signed By: Genevive BiStewart Edmunds M.D. On: 11/28/2015 11:45 ____________________________________________   PROCEDURES   ____________________________________________   INITIAL IMPRESSION / ASSESSMENT AND PLAN / ED COURSE  Pertinent labs & imaging results that were available during my care of the patient were reviewed by me and considered in my medical decision making (see chart for details).  ----------------------------------------- 11:18 AM on 11/28/2015 -----------------------------------------  Discussed case with Dr. Joice LoftsPoggi of orthopedics who shares my concern for flexor tenosynovitis. Recommends transfer to [facility].  ----------------------------------------- 11:47 AM on 11/28/2015 -----------------------------------------  [facility] Dr. Mina MarbleWeingold, who says that there is no need for tertiary care center at this time. He recommends that Dr. Joice LoftsPoggi see the patient here in [place]. I discussed this with Dr. Joice LoftsPoggi and he will be down to see the patient.  ----------------------------------------- 12:48 PM on 11/28/2015 -----------------------------------------  Evaluated the bedside by Dr. Joice LoftsPoggi does not think the patient has flexor tenosynovitis at this time. However, he does recommend  admission for IV antibiotics. We'll admit to the hospital. Signed out to Dr. Judithann SheenSparks. Patient with the plan and willing to comply. ____________________________________________   FINAL CLINICAL IMPRESSION(S) / ED DIAGNOSES  Cellulitis of the left hand and index finger.    Myrna Blazeravid Matthew Schaevitz, MD 11/28/15 1249

## 2015-11-28 NOTE — ED Notes (Signed)
Pt states red area started on her hand yesterday and today with moderate amount of redness to left hand. Swelling noted hand and fingers. Denies fever. History of MRSA

## 2015-11-28 NOTE — Progress Notes (Signed)
Patient reports she was told in the ED a few months ago she had MRSA "all in her blood" and was sent home with antibiotics to treat the abscess/boils. There are no results indicating she has had a positive MRSA result. MRSA PCR completed this evening NEGATIVE. Will remove contact precautions.

## 2015-11-28 NOTE — Plan of Care (Signed)
Problem: Physical Regulation: Goal: Ability to avoid or minimize complications of infection will improve Outcome: Progressing Pt admitted today from the ED. VSS. L hand redness/swelling/warmth with blister on L 2nd finger draining scant purulent drainage. Pain meds given with improvement. Xanax given for anxiety with improvement. NPO after midnight for possible I&D per Dr Joice LoftsPoggi.

## 2015-11-29 LAB — COMPREHENSIVE METABOLIC PANEL
ALBUMIN: 3.2 g/dL — AB (ref 3.5–5.0)
ALT: 8 U/L — AB (ref 14–54)
AST: 12 U/L — AB (ref 15–41)
Alkaline Phosphatase: 52 U/L (ref 38–126)
Anion gap: 7 (ref 5–15)
BILIRUBIN TOTAL: 0.4 mg/dL (ref 0.3–1.2)
BUN: 10 mg/dL (ref 6–20)
CHLORIDE: 109 mmol/L (ref 101–111)
CO2: 25 mmol/L (ref 22–32)
CREATININE: 0.66 mg/dL (ref 0.44–1.00)
Calcium: 8 mg/dL — ABNORMAL LOW (ref 8.9–10.3)
GFR calc Af Amer: >60 mL/min (ref >60)
GLUCOSE: 112 mg/dL — AB (ref 65–99)
POTASSIUM: 3.9 mmol/L (ref 3.5–5.1)
Sodium: 141 mmol/L (ref 135–145)
Total Protein: 5.6 g/dL — ABNORMAL LOW (ref 6.5–8.1)

## 2015-11-29 LAB — CBC
HEMATOCRIT: 35.9 % (ref 35.0–47.0)
Hemoglobin: 11.9 g/dL — ABNORMAL LOW (ref 12.0–16.0)
MCH: 29.2 pg (ref 26.0–34.0)
MCHC: 33.2 g/dL (ref 32.0–36.0)
MCV: 87.9 fL (ref 80.0–100.0)
PLATELETS: 179 10*3/uL (ref 150–440)
RBC: 4.08 MIL/uL (ref 3.80–5.20)
RDW: 14.5 % (ref 11.5–14.5)
WBC: 10.1 10*3/uL (ref 3.6–11.0)

## 2015-11-29 MED ORDER — NICOTINE POLACRILEX 2 MG MT GUM
2.0000 mg | CHEWING_GUM | OROMUCOSAL | Status: DC | PRN
  Administered 2015-11-29: 2 mg via ORAL
  Filled 2015-11-29: qty 2

## 2015-11-29 MED ORDER — ENOXAPARIN SODIUM 30 MG/0.3ML ~~LOC~~ SOLN
30.0000 mg | SUBCUTANEOUS | Status: DC
  Administered 2015-11-29: 30 mg via SUBCUTANEOUS
  Filled 2015-11-29: qty 0.3

## 2015-11-29 NOTE — Progress Notes (Signed)
[facility] [place] [facility]   PATIENT NAME: Taylor Larsen    MR#:  [id number]  DATE OF BIRTH:  02/22/1961  SUBJECTIVE:   Patient is here due to left upper extremity cellulitis. Still having significant pain in the left index finger with an open area but no drainage. No fever, chills, nausea, vomiting.  REVIEW OF SYSTEMS:    Review of Systems  Constitutional: Negative for fever and chills.  HENT: Negative for congestion and tinnitus.   Eyes: Negative for blurred vision and double vision.  Respiratory: Negative for cough, shortness of breath and wheezing.   Cardiovascular: Negative for chest pain, orthopnea and PND.  Gastrointestinal: Negative for nausea, vomiting, abdominal pain and diarrhea.  Genitourinary: Negative for dysuria and hematuria.  Neurological: Negative for dizziness, sensory change and focal weakness.  All other systems reviewed and are negative.   Nutrition: Regular Tolerating Diet: Yes Tolerating PT: Ambulatory     DRUG ALLERGIES:  No Known Allergies  VITALS:  Blood pressure 108/56, pulse 65, temperature 98.2 F (36.8 C), temperature source Oral, resp. rate 18, height 5' (1.524 m), weight 45.178 kg (99 lb 9.6 oz), SpO2 95 %.  PHYSICAL EXAMINATION:   Physical Exam  GENERAL:  55 y.o.-year-old patient lying in the bed with no acute distress.  EYES: Pupils equal, round, reactive to light and accommodation. No scleral icterus. Extraocular muscles intact.  HEENT: Head atraumatic, normocephalic. Oropharynx and nasopharynx clear.  NECK:  Supple, no jugular venous distention. No thyroid enlargement, no tenderness.  LUNGS: Normal breath sounds bilaterally, no wheezing, rales, rhonchi. No use of accessory muscles of respiration.  CARDIOVASCULAR: S1, S2 normal. No murmurs, rubs, or gallops.  ABDOMEN: Soft, nontender, nondistended. Bowel sounds present. No organomegaly or mass.  EXTREMITIES: No cyanosis, clubbing or edema b/l.   Left upper  extremity cellulitis. Open area on the left index finger with no acute drainage and distal redness. NEUROLOGIC: Cranial nerves II through XII are intact. No focal Motor or sensory deficits b/l.   PSYCHIATRIC: The patient is alert and oriented x 3.  SKIN: No obvious rash, lesion, or ulcer.    LABORATORY PANEL:   CBC  Recent Labs Lab 11/29/15 0508  WBC 10.1  HGB 11.9*  HCT 35.9  PLT 179   ------------------------------------------------------------------------------------------------------------------  Chemistries   Recent Labs Lab 11/29/15 0508  NA 141  K 3.9  CL 109  CO2 25  GLUCOSE 112*  BUN 10  CREATININE 0.66  CALCIUM 8.0*  AST 12*  ALT 8*  ALKPHOS 52  BILITOT 0.4   ------------------------------------------------------------------------------------------------------------------  Cardiac Enzymes No results for input(s): TROPONINI in the last 168 hours. ------------------------------------------------------------------------------------------------------------------  RADIOLOGY:  Dg Hand 2 View Left  11/28/2015  CLINICAL DATA:  Pt reports feeling a sting when doing yard work a few days ago, blister noted to left dorsal hand, pt presents to ED today with redness and swelling, concern for cellulitis. EXAM: LEFT HAND - 2 VIEW COMPARISON:  None. FINDINGS: No evidence of fracture of the carpal or metacarpal bones. Radiocarpal joint is intact. Phalanges are normal. No soft tissue injury. No foreign body IMPRESSION: No fracture or  foreign body. Electronically Signed   By: Genevive BiStewart  Edmunds M.D.   On: 11/28/2015 11:45     ASSESSMENT AND PLAN:   55 year old female with past medical history of COPD, anxiety, depression who presents to the hospital due to left upper extremity redness swelling and noted to have cellulitis.  1. Left upper extremity cellulitis-continue supportive care with IV  Vanco, Zosyn. -Afebrile, hemodynamically stable. White cell count  stable. -Appreciate orthopedic input, possible need for incision and drainage but they will reevaluate patient in the morning.  2. Anxiety - cont. PRN Xanax.   3. GERD - cont. Protonix.    All the records are reviewed and case discussed with Care Management/Social Workerr. Management plans discussed with the patient, family and they are in agreement.  CODE STATUS: Full code  DVT Prophylaxis: Lovenox  TOTAL TIME TAKING CARE OF THIS PATIENT: 30 minutes.   POSSIBLE D/C IN 1-2 DAYS, DEPENDING ON CLINICAL CONDITION.   Houston Siren M.D on 11/29/2015 at 1:51 PM  Between 7am to 6pm - Pager - [phone]  After 6pm go to [email or web address] - password [email or web address] [facility]  [facility]  Office  [phone]  CC: Primary care physician; No PCP Per Patient

## 2015-11-29 NOTE — Progress Notes (Signed)
Subjective: Patient notes slight improvement in her left hand symptoms versus yesterday, but still had a "rough night". Pain is reasonably controlled with present pain medication regimen.   Objective: Vital signs in last 24 hours: Temp:  [97.4 F (36.3 C)-98.2 F (36.8 C)] 98.2 F (36.8 C) (04/24 0558) Pulse Rate:  [57-81] 65 (04/24 0627) Resp:  [16-20] 18 (04/24 0558) BP: (104-161)/(48-70) 108/56 mmHg (04/24 0627) SpO2:  [95 %-100 %] 95 % (04/24 0558) Weight:  [44.906 kg (99 lb)-45.178 kg (99 lb 9.6 oz)] 45.178 kg (99 lb 9.6 oz) (04/24 0500)  Intake/Output from previous day: 04/23 0701 - 04/24 0700 In: 240 [P.O.:240] Out: -  Intake/Output this shift:     Recent Labs  11/28/15 1142 11/29/15 0508  HGB 14.6 11.9*    Recent Labs  11/28/15 1142 11/29/15 0508  WBC 15.2* 10.1  RBC 5.04 4.08  HCT 42.6 35.9  PLT 237 179    Recent Labs  11/28/15 1142 11/29/15 0508  NA 140 141  K 3.9 3.9  CL 106 109  CO2 25 25  BUN 8 10  CREATININE 0.60 0.66  GLUCOSE 121* 112*  CALCIUM 9.4 8.0*   No results for input(s): LABPT, INR in the last 72 hours.  Physical Exam: Orthopedic examination is limited to the left hand. The swelling appears to be slightly improved today versus yesterday as some wrinkles are appearing on the dorsum of her hand. The erythema is slightly larger on the dorsal aspect of her hand. Again, there is no erythema or significant swelling on the palmar surface of her hand and her index finger. She is able to flex and extend her index finger better today than yesterday. She remains neurovascularly intact to all digits.  Assessment: Left dorsal hand cellulitis secondary to spider bite of left index finger.  Plan: Continue IV antibiotics and close observation. Continue to keep left hand elevated on pillows. I will allow the patient to eat today and we will make patient NPO after midnight tonight as a precaution for possible I&D tomorrow if indicated clinically.  However, based on patient's examination today, I feel that this has a low likelihood of being necessary.   Excell SeltzerJohn J Kerry Odonohue 11/29/2015, 8:15 AM

## 2015-11-29 NOTE — Progress Notes (Signed)
Initial Nutrition Assessment       INTERVENTION:  Monitor intake and cater to pt preferences   NUTRITION DIAGNOSIS:    (none at this time as eating well) related to   as evidenced by  .    GOAL:   Patient will meet greater than or equal to 90% of their needs    MONITOR:   PO intake  REASON FOR ASSESSMENT:   Malnutrition Screening Tool    ASSESSMENT:   55 y/o female admitted with left had cellulitis following a spider bite  Past Medical History  Diagnosis Date  . Abscess   . COPD (chronic obstructive pulmonary disease) (HCC)   . Anxiety   . Heart palpitations   . Depression    Medications reviewed protonix, NS at 14200ml/hr Labs reviewed  Pt reports ate eggs, toast this am and tolerated well. Reports appetite is up and down.  " I have anxiety which effects my appetite."  Nutrition-Focused physical exam completed. Findings are WDL for fat depletion, muscle depletion, and edema.    Diet Order:  Diet regular Room service appropriate?: Yes; Fluid consistency:: Thin Diet NPO time specified Except for: Sips with Meds  Skin:  Reviewed, no issues  Last BM:  4/22  Height:   Ht Readings from Last 1 Encounters:  11/28/15 5' (1.524 m)    Weight: Pt reports wt up and down. Per wt encounters wt gain prior to admission  Wt Readings from Last 1 Encounters:  11/29/15 99 lb 9.6 oz (45.178 kg)    Ideal Body Weight:     BMI:  Body mass index is 19.45 kg/(m^2).  Estimated Nutritional Needs:   Kcal:  1350-1575 kcals/d  Protein:  45-54 g/d  Fluid:  1.3-1.5 L/d  EDUCATION NEEDS:   No education needs identified at this time  Colene Mines B. Freida BusmanAllen, RD, LDN [phone] (pager) Weekend/On-Call pager [phone])

## 2015-11-30 LAB — CBC
HCT: 33.7 % — ABNORMAL LOW (ref 35.0–47.0)
Hemoglobin: 11.5 g/dL — ABNORMAL LOW (ref 12.0–16.0)
MCH: 29.2 pg (ref 26.0–34.0)
MCHC: 34.2 g/dL (ref 32.0–36.0)
MCV: 85.3 fL (ref 80.0–100.0)
PLATELETS: 176 10*3/uL (ref 150–440)
RBC: 3.95 MIL/uL (ref 3.80–5.20)
RDW: 14.3 % (ref 11.5–14.5)
WBC: 6.8 10*3/uL (ref 3.6–11.0)

## 2015-11-30 LAB — CREATININE, SERUM
Creatinine, Ser: 0.67 mg/dL (ref 0.44–1.00)
GFR calc Af Amer: >60 mL/min (ref >60)
GFR calc non Af Amer: >60 mL/min (ref >60)

## 2015-11-30 MED ORDER — SULFAMETHOXAZOLE-TRIMETHOPRIM 800-160 MG PO TABS
1.0000 | ORAL_TABLET | Freq: Two times a day (BID) | ORAL | Status: DC
Start: 2015-11-30 — End: 2022-12-05

## 2015-11-30 NOTE — Care Management (Signed)
Presented to [facility] with the diagnosis of cellulitis possibly from a spider. Lives with son, Taylor Larsen [phone]). States she hasn't seen a primary care physician in a while. States she thinks she went to the [facility] about 12 years ago. No insurance. No income. States she was suppose to start a job with [employer] today. States she takes care of all basic and instrumental activities of daily living herself, drives. No medical equipment in the home. No falls. Decreased appetite all her life. Applications for [facility] and Medication Management given to Taylor Larsen. States she she can afford medications, [facility] Taylor Calix S [name] RN MSN CCM Care Management [phone]

## 2015-11-30 NOTE — Discharge Summary (Signed)
[employer] [facility] - [place] [facility]   PATIENT NAME: Taylor Larsen    MR#:  [id number]  DATE OF BIRTH:  12/02/1960  DATE OF ADMISSION:  11/28/2015 ADMITTING PHYSICIAN: Marguarite ArbourJeffrey D Sparks, MD  DATE OF DISCHARGE: 11/30/2015 12:00 PM  PRIMARY CARE PHYSICIAN: No PCP Per Patient    ADMISSION DIAGNOSIS:  Cellulitis of left upper extremity [L03.114]  DISCHARGE DIAGNOSIS:  Principal Problem:   Cellulitis Active Problems:   Left hand pain   HTN (hypertension)   GAD (generalized anxiety disorder)   SECONDARY DIAGNOSIS:   Past Medical History  Diagnosis Date  . Abscess   . COPD (chronic obstructive pulmonary disease) (HCC)   . Anxiety   . Heart palpitations   . Depression     HOSPITAL COURSE:   55 year old female with past medical history of COPD, anxiety, depression who presents to the hospital due to left upper extremity redness swelling and noted to have cellulitis.  1. Left upper extremity cellulitis-patient was admitted to this hospital and started on broad-spectrum IV antibiotics with vancomycin, Zosyn.  -Patient was also seen by orthopedics who initially thought that she would need I&D but with aggressive IV antibiotics, redness and swelling is improved. She is now being discharged on oral Bactrim for additional 7 days. -Patient's wound cultures grew out MRSA and was sensitive to Bactrim.   DISCHARGE CONDITIONS:   Stable  CONSULTS OBTAINED:  Treatment Team:  Christena FlakeJohn J Poggi, MD  DRUG ALLERGIES:  No Known Allergies  DISCHARGE MEDICATIONS:   Discharge Medication List as of 11/30/2015 11:29 AM    START taking these medications   Details  sulfamethoxazole-trimethoprim (BACTRIM DS) 800-160 MG tablet Take 1 tablet by mouth 2 (two) times daily., Starting 11/30/2015, Until Discontinued, Print      CONTINUE these medications which have NOT CHANGED   Details  HYDROcodone-acetaminophen (NORCO/VICODIN) 5-325 MG tablet Take 1 tablet by mouth every 4 (four) hours  as needed for moderate pain., Starting 08/24/2015, Until Discontinued, Print      STOP taking these medications     doxycycline (VIBRA-TABS) 100 MG tablet          DISCHARGE INSTRUCTIONS:   DIET:  Regular diet  DISCHARGE CONDITION:  Stable  ACTIVITY:  Activity as tolerated  OXYGEN:  Home Oxygen: No.   Oxygen Delivery: room air  DISCHARGE LOCATION:  home   If you experience worsening of your admission symptoms, develop shortness of breath, life threatening emergency, suicidal or homicidal thoughts you must seek medical attention immediately by calling 911 or calling your MD immediately  if symptoms less severe.  You Must read complete instructions/literature along with all the possible adverse reactions/side effects for all the Medicines you take and that have been prescribed to you. Take any new Medicines after you have completely understood and accpet all the possible adverse reactions/side effects.   Please note  You were cared for by a hospitalist during your hospital stay. If you have any questions about your discharge medications or the care you received while you were in the hospital after you are discharged, you can call the unit and asked to speak with the hospitalist on call if the hospitalist that took care of you is not available. Once you are discharged, your primary care physician will handle any further medical issues. Please note that NO REFILLS for any discharge medications will be authorized once you are discharged, as it is imperative that you return to your primary care physician (or establish a relationship with  a primary care physician if you do not have one) for your aftercare needs so that they can reassess your need for medications and monitor your lab values.     Today   Left upper extremity redness and swelling improved. No fever. White cell count stable.  VITAL SIGNS:  Blood pressure 98/58, pulse 66, temperature 98.4 F (36.9 C), temperature  source Oral, resp. rate 18, height 5' (1.524 m), weight 48.535 kg (107 lb), SpO2 97 %.  I/O:   Intake/Output Summary (Last 24 hours) at 11/30/15 1506 Last data filed at 11/30/15 0900  Gross per 24 hour  Intake    480 ml  Output      0 ml  Net    480 ml    PHYSICAL EXAMINATION:  GENERAL:  55 y.o.-year-old patient lying in the bed with no acute distress.  EYES: Pupils equal, round, reactive to light and accommodation. No scleral icterus. Extraocular muscles intact.  HEENT: Head atraumatic, normocephalic. Oropharynx and nasopharynx clear.  NECK:  Supple, no jugular venous distention. No thyroid enlargement, no tenderness.  LUNGS: Normal breath sounds bilaterally, no wheezing, rales,rhonchi. No use of accessory muscles of respiration.  CARDIOVASCULAR: S1, S2 normal. No murmurs, rubs, or gallops.  ABDOMEN: Soft, non-tender, non-distended. Bowel sounds present. No organomegaly or mass.  EXTREMITIES: No pedal edema, cyanosis, or clubbing.  NEUROLOGIC: Cranial nerves II through XII are intact. No focal motor or sensory defecits b/l.  PSYCHIATRIC: The patient is alert and oriented x 3. Good affect.  SKIN: No obvious rash, lesion, or ulcer. Left upper extremity index finger mild redness with a small open area without acute drainage.  DATA REVIEW:   CBC  Recent Labs Lab 11/30/15 0439  WBC 6.8  HGB 11.5*  HCT 33.7*  PLT 176    Chemistries   Recent Labs Lab 11/29/15 0508 11/30/15 0439  NA 141  --   K 3.9  --   CL 109  --   CO2 25  --   GLUCOSE 112*  --   BUN 10  --   CREATININE 0.66 0.67  CALCIUM 8.0*  --   AST 12*  --   ALT 8*  --   ALKPHOS 52  --   BILITOT 0.4  --     Cardiac Enzymes No results for input(s): TROPONINI in the last 168 hours.  Microbiology Results  Results for orders placed or performed during the hospital encounter of 11/28/15  CULTURE, BLOOD (ROUTINE X 2) w Reflex to PCR ID Panel     Status: None (Preliminary result)   Collection Time: 11/28/15   1:29 PM  Result Value Ref Range Status   Specimen Description BLOOD LEFT ANTECUBITAL  Final   Special Requests BOTTLES DRAWN AEROBIC AND ANAEROBIC  13CC  Final   Culture NO GROWTH 2 DAYS  Final   Report Status PENDING  Incomplete  Wound culture     Status: None (Preliminary result)   Collection Time: 11/28/15  1:58 PM  Result Value Ref Range Status   Specimen Description ABSCESS  Final   Special Requests Normal  Final   Gram Stain   Final    FEW WBC SEEN FEW GRAM POSITIVE COCCI IN CLUSTERS    Culture   Final    HEAVY GROWTH METHICILLIN RESISTANT STAPHYLOCOCCUS AUREUS   Report Status PENDING  Incomplete   Organism ID, Bacteria METHICILLIN RESISTANT STAPHYLOCOCCUS AUREUS  Final      Susceptibility   Methicillin resistant staphylococcus aureus - MIC*  CIPROFLOXACIN >=8 RESISTANT Resistant     ERYTHROMYCIN <=0.25 SENSITIVE Sensitive     GENTAMICIN <=0.5 SENSITIVE Sensitive     OXACILLIN >=4 RESISTANT Resistant     TETRACYCLINE <=1 SENSITIVE Sensitive     VANCOMYCIN 1 SENSITIVE Sensitive     TRIMETH/SULFA <=10 SENSITIVE Sensitive     CLINDAMYCIN <=0.25 SENSITIVE Sensitive     RIFAMPIN <=0.5 SENSITIVE Sensitive     Inducible Clindamycin NEGATIVE Sensitive     * HEAVY GROWTH METHICILLIN RESISTANT STAPHYLOCOCCUS AUREUS  CULTURE, BLOOD (ROUTINE X 2) w Reflex to PCR ID Panel     Status: None (Preliminary result)   Collection Time: 11/28/15  2:55 PM  Result Value Ref Range Status   Specimen Description BLOOD RT ARM AC  Final   Special Requests   Final    BOTTLES DRAWN AEROBIC AND ANAEROBIC AER 5CC ANA 3CC   Culture NO GROWTH 2 DAYS  Final   Report Status PENDING  Incomplete  MRSA PCR Screening     Status: None   Collection Time: 11/28/15  7:37 PM  Result Value Ref Range Status   MRSA by PCR NEGATIVE NEGATIVE Final    Comment:        The GeneXpert MRSA Assay (FDA approved for NASAL specimens only), is one component of a comprehensive MRSA colonization surveillance program. It  is not intended to diagnose MRSA infection nor to guide or monitor treatment for MRSA infections.     RADIOLOGY:  No results found.    Management plans discussed with the patient, family and they are in agreement.  CODE STATUS:     Code Status Orders        Start     Ordered   11/28/15 1442  Full code   Continuous     11/28/15 1441    Code Status History    Date Active Date Inactive Code Status Order ID Comments User Context   This patient has a current code status but no historical code status.      TOTAL TIME TAKING CARE OF THIS PATIENT: 40 minutes.    Houston Siren M.D on 11/30/2015 at 3:06 PM  Between 7am to 6pm - Pager - [phone]  After 6pm go to [email or web address] - password [email or web address] [facility]  [facility]  Office  [phone]  CC: Primary care physician; No PCP Per Patient

## 2015-11-30 NOTE — Progress Notes (Signed)
Subjective: The patient notes further improvement in her left hand symptoms. She is able to wiggle her fingers more freely. Her pain remains well-controlled with po pain medication.   Objective: Vital signs in last 24 hours: Temp:  [97.8 F (36.6 C)-98.7 F (37.1 C)] 98.5 F (36.9 C) (04/25 0518) Pulse Rate:  [51-64] 51 (04/25 0520) Resp:  [18] 18 (04/25 0518) BP: (95-135)/(47-59) 95/54 mmHg (04/25 0520) SpO2:  [97 %-100 %] 97 % (04/25 0518) Weight:  [48.535 kg (107 lb)] 48.535 kg (107 lb) (04/25 0500)  Intake/Output from previous day: 04/24 0701 - 04/25 0700 In: 480 [P.O.:480] Out: -  Intake/Output this shift:     Recent Labs  11/28/15 1142 11/29/15 0508 11/30/15 0439  HGB 14.6 11.9* 11.5*    Recent Labs  11/29/15 0508 11/30/15 0439  WBC 10.1 6.8  RBC 4.08 3.95  HCT 35.9 33.7*  PLT 179 176    Recent Labs  11/28/15 1142 11/29/15 0508 11/30/15 0439  NA 140 141  --   K 3.9 3.9  --   CL 106 109  --   CO2 25 25  --   BUN 8 10  --   CREATININE 0.60 0.66 0.67  GLUCOSE 121* 112*  --   CALCIUM 9.4 8.0*  --    No results for input(s): LABPT, INR in the last 72 hours.  Physical Exam: The area of swelling and erythema over the dorsal aspect of her left index finger and hand has improved significantly since yesterday. There is still mild-moderate tenderness to palpation over the dorsal aspect of her hand and finger. She is able to actively flex and extend her index finger more freely, exhibiting at least 50% of normal motion. She remains neurovascularly intact to all digits.  Assessment: Left index finger and dorsal hand cellulitis status post apparent spider bite, improving symptomatically.  Plan: At this point, there does not appear to be any indication for surgical intervention as she is responding quite well to the IV antibiotics. I feel that she can be permitted to eat a regular diet. Consideration as to changing her to by mouth antibiotics can be entertained  at this time with the plan of having her be discharged either later today or tomorrow, based on her overall clinical picture.  Thank you for asking me to participate in the care of this most pleasant patient. I will be happy to see her in follow-up in my office next week.   Excell SeltzerJohn J Egan Berkheimer 11/30/2015, 9:26 AM

## 2015-11-30 NOTE — Progress Notes (Signed)
Pt has been discharged home. Discharge instructions given and explained to pt. Pt verbalized understanding. Meds reviewed with pt. RX given to pt. Pt to f/u with open clinic.

## 2015-12-02 LAB — WOUND CULTURE: SPECIAL REQUESTS: NORMAL

## 2015-12-03 LAB — CULTURE, BLOOD (ROUTINE X 2)
CULTURE: NO GROWTH
Culture: NO GROWTH

## 2018-04-05 ENCOUNTER — Emergency Department
Admission: EM | Admit: 2018-04-05 | Discharge: 2018-04-05 | Disposition: A | Discharge status: Home / Self Care | Payer: Self-pay | Attending: Emergency Medicine | Admitting: Emergency Medicine

## 2018-04-05 ENCOUNTER — Other Ambulatory Visit: Payer: Self-pay

## 2018-04-05 ENCOUNTER — Encounter: Payer: Self-pay | Admitting: Emergency Medicine

## 2018-04-05 ENCOUNTER — Emergency Department: Payer: Self-pay

## 2018-04-05 DIAGNOSIS — J449 Chronic obstructive pulmonary disease, unspecified: Secondary | ICD-10-CM | POA: Insufficient documentation

## 2018-04-05 DIAGNOSIS — S161XXA Strain of muscle, fascia and tendon at neck level, initial encounter: Secondary | ICD-10-CM | POA: Insufficient documentation

## 2018-04-05 DIAGNOSIS — Y999 Unspecified external cause status: Secondary | ICD-10-CM | POA: Insufficient documentation

## 2018-04-05 DIAGNOSIS — Y939 Activity, unspecified: Secondary | ICD-10-CM | POA: Insufficient documentation

## 2018-04-05 DIAGNOSIS — Y929 Unspecified place or not applicable: Secondary | ICD-10-CM | POA: Insufficient documentation

## 2018-04-05 DIAGNOSIS — I10 Essential (primary) hypertension: Secondary | ICD-10-CM | POA: Insufficient documentation

## 2018-04-05 DIAGNOSIS — X58XXXA Exposure to other specified factors, initial encounter: Secondary | ICD-10-CM | POA: Insufficient documentation

## 2018-04-05 DIAGNOSIS — F17200 Nicotine dependence, unspecified, uncomplicated: Secondary | ICD-10-CM | POA: Insufficient documentation

## 2018-04-05 LAB — COMPREHENSIVE METABOLIC PANEL
ALT: 20 U/L (ref 0–44)
AST: 18 U/L (ref 15–41)
Albumin: 4.2 g/dL (ref 3.5–5.0)
Alkaline Phosphatase: 56 U/L (ref 38–126)
Anion gap: 10 (ref 5–15)
BUN: 9 mg/dL (ref 6–20)
CHLORIDE: 104 mmol/L (ref 98–111)
CO2: 31 mmol/L (ref 22–32)
CREATININE: 0.53 mg/dL (ref 0.44–1.00)
Calcium: 9.2 mg/dL (ref 8.9–10.3)
GFR calc non Af Amer: >60 mL/min (ref >60)
Glucose, Bld: 91 mg/dL (ref 70–99)
POTASSIUM: 3.9 mmol/L (ref 3.5–5.1)
SODIUM: 145 mmol/L (ref 135–145)
Total Bilirubin: 0.6 mg/dL (ref 0.3–1.2)
Total Protein: 6.9 g/dL (ref 6.5–8.1)

## 2018-04-05 LAB — CBC WITH DIFFERENTIAL/PLATELET
Basophils Absolute: 0.1 10*3/uL (ref 0–0.1)
Basophils Relative: 1 %
EOS ABS: 0.2 10*3/uL (ref 0–0.7)
Eosinophils Relative: 2 %
HEMATOCRIT: 40.3 % (ref 35.0–47.0)
HEMOGLOBIN: 13.7 g/dL (ref 12.0–16.0)
LYMPHS ABS: 2.8 10*3/uL (ref 1.0–3.6)
LYMPHS PCT: 27 %
MCH: 29.5 pg (ref 26.0–34.0)
MCHC: 34 g/dL (ref 32.0–36.0)
MCV: 86.8 fL (ref 80.0–100.0)
MONOS PCT: 6 %
Monocytes Absolute: 0.6 10*3/uL (ref 0.2–0.9)
NEUTROS ABS: 6.6 10*3/uL — AB (ref 1.4–6.5)
NEUTROS PCT: 64 %
Platelets: 224 10*3/uL (ref 150–440)
RBC: 4.65 MIL/uL (ref 3.80–5.20)
RDW: 14.8 % — ABNORMAL HIGH (ref 11.5–14.5)
WBC: 10.4 10*3/uL (ref 3.6–11.0)

## 2018-04-05 MED ORDER — HYDROCODONE-ACETAMINOPHEN 5-325 MG PO TABS: 1.0000 | ORAL_TABLET | Freq: Four times a day (QID) | ORAL | 0 refills | Status: DC | PRN

## 2018-04-05 MED ORDER — KETOROLAC TROMETHAMINE 30 MG/ML IJ SOLN
15.0000 mg | Freq: Once | INTRAMUSCULAR | Status: AC
  Administered 2018-04-05: 15 mg via INTRAVENOUS
  Filled 2018-04-05: qty 1

## 2018-04-05 MED ORDER — LIDOCAINE 5 % EX PTCH
1.0000 | MEDICATED_PATCH | Freq: Once | CUTANEOUS | Status: DC
  Administered 2018-04-05: 1 via TRANSDERMAL
  Filled 2018-04-05: qty 1

## 2018-04-05 MED ORDER — IOPAMIDOL (ISOVUE-370) INJECTION 76%
75.0000 mL | Freq: Once | INTRAVENOUS | Status: AC | PRN
  Administered 2018-04-05: 75 mL via INTRAVENOUS

## 2018-04-05 MED ORDER — DICLOFENAC SODIUM 1 % TD GEL: 4.0000 g | Freq: Four times a day (QID) | TRANSDERMAL | 0 refills | Status: DC | PRN

## 2018-04-05 NOTE — Discharge Instructions (Addendum)
Fortunately today your lab work and your CT scan were reassuring.  You most likely pulled a muscle in your neck although it is possible you have a pinched nerve.  Please take your pain medication as needed for severe symptoms and follow-up with primary care for reevaluation and to consider a possible MRI in the future.  Return to the emergency department sooner for any concerns whatsoever.  It was a pleasure to take care of you today, and thank you for coming to our emergency department.  If you have any questions or concerns before leaving please ask the nurse to grab me and I'm more than happy to go through your aftercare instructions again.  If you were prescribed any opioid pain medication today such as Norco, Vicodin, Percocet, morphine, hydrocodone, or oxycodone please make sure you do not drive when you are taking this medication as it can alter your ability to drive safely.  If you have any concerns once you are home that you are not improving or are in fact getting worse before you can make it to your follow-up appointment, please do not hesitate to call 911 and come back for further evaluation.  Merrily Brittle, MD  Results for orders placed or performed during the hospital encounter of 04/05/18  Comprehensive metabolic panel  Result Value Ref Range   Sodium 145 135 - 145 mmol/L   Potassium 3.9 3.5 - 5.1 mmol/L   Chloride 104 98 - 111 mmol/L   CO2 31 22 - 32 mmol/L   Glucose, Bld 91 70 - 99 mg/dL   BUN 9 6 - 20 mg/dL   Creatinine, Ser 1.61 0.44 - 1.00 mg/dL   Calcium 9.2 8.9 - 09.6 mg/dL   Total Protein 6.9 6.5 - 8.1 g/dL   Albumin 4.2 3.5 - 5.0 g/dL   AST 18 15 - 41 U/L   ALT 20 0 - 44 U/L   Alkaline Phosphatase 56 38 - 126 U/L   Total Bilirubin 0.6 0.3 - 1.2 mg/dL   GFR calc non Af Amer >60 >60 mL/min   GFR calc Af Amer >60 >60 mL/min   Anion gap 10 5 - 15  CBC with Differential  Result Value Ref Range   WBC 10.4 3.6 - 11.0 K/uL   RBC 4.65 3.80 - 5.20 MIL/uL   Hemoglobin  13.7 12.0 - 16.0 g/dL   HCT 04.5 40.9 - 81.1 %   MCV 86.8 80.0 - 100.0 fL   MCH 29.5 26.0 - 34.0 pg   MCHC 34.0 32.0 - 36.0 g/dL   RDW 91.4 (H) 78.2 - 95.6 %   Platelets 224 150 - 440 K/uL   Neutrophils Relative % 64 %   Neutro Abs 6.6 (H) 1.4 - 6.5 K/uL   Lymphocytes Relative 27 %   Lymphs Abs 2.8 1.0 - 3.6 K/uL   Monocytes Relative 6 %   Monocytes Absolute 0.6 0.2 - 0.9 K/uL   Eosinophils Relative 2 %   Eosinophils Absolute 0.2 0 - 0.7 K/uL   Basophils Relative 1 %   Basophils Absolute 0.1 0 - 0.1 K/uL   Ct Angio Neck W And/or Wo Contrast  Result Date: 04/05/2018 CLINICAL DATA:  57 y/o F; neck pain with head movement. History of adjustments with a chiropractor and heavy lifting at work. EXAM: CT ANGIOGRAPHY NECK TECHNIQUE: Multidetector CT imaging of the neck was performed using the standard protocol during bolus administration of intravenous contrast. Multiplanar CT image reconstructions and MIPs were obtained to evaluate the  vascular anatomy. Carotid stenosis measurements (when applicable) are obtained utilizing NASCET criteria, using the distal internal carotid diameter as the denominator. CONTRAST:  75mL ISOVUE-370 IOPAMIDOL (ISOVUE-370) INJECTION 76% COMPARISON:  10/13/2013 CT cervical spine.  10/30/2013 PET-CT. FINDINGS: Aortic arch: Aberrant right subclavian artery. Mild calcific atherosclerosis of the aortic arch without significant stenosis of great vessel origins. No dissection, aneurysm, or findings of vasculitis. Right carotid system: No evidence of dissection, stenosis (50% or greater) or occlusion. Mild fibrofatty plaque without significant stenosis. Left carotid system: No evidence of dissection, stenosis (50% or greater) or occlusion. Mild fibrofatty plaque without significant stenosis. Vertebral arteries: Left dominant. No evidence of dissection, stenosis (50% or greater) or occlusion. Skeleton: Negative. Other neck: Negative. Upper chest: Mild centrilobular emphysema of the  lung apices. IMPRESSION: 1. Patent carotid and vertebral arteries. No dissection, aneurysm, or hemodynamically significant stenosis by NASCET criteria. 2. Aberrant right subclavian artery. 3. Mild calcific atherosclerosis of the aortic arch and great vessel origins. Mild non stenotic fibrofatty plaque of the carotid systems. 4. Mild centrilobular emphysema of the lungs. 5. No acute fracture or malalignment of the cervical spine identified. Electronically Signed   By: Mitzi HansenLance  Furusawa-Stratton M.D.   On: 04/05/2018 55:06

## 2018-04-05 NOTE — ED Provider Notes (Signed)
[facility] Emergency Department Provider Note  ____________________________________________   First MD Initiated Contact with Patient 04/05/18 [phone]     (approximate)  I have reviewed the triage vital signs and the nursing notes.   HISTORY  Chief Complaint Torticollis   HPI Taylor Larsen is a 57 y.o. female who self presents to the emergency department with neck stiffness.  Symptoms are cramping moderate to severe worse with movement improved with rest.  She has a long-standing history of the same and was recently seen by a chiropractor who manipulated her neck and seems to have exacerbated the symptoms.  She denies numbness or weakness.  She denies double vision or blurred vision.  She denies slurred speech.  She denies headache.  She denies other trauma.   Symptoms came on gradually although are acutely worse.  She also lifts "a lot of weight" at work and feels like she might of pulled a muscle.  She is taken over-the-counter medication with no relief.  Pain is worse when lifting.   Past Medical History:  Diagnosis Date  . Abscess   . Anxiety   . COPD (chronic obstructive pulmonary disease) (HCC)   . Depression   . Heart palpitations     Patient Active Problem List   Diagnosis Date Noted  . Left hand pain 11/28/2015  . Cellulitis 11/28/2015  . HTN (hypertension) 11/28/2015  . GAD (generalized anxiety disorder) 11/28/2015    Past Surgical History:  Procedure Laterality Date  . CESAREAN SECTION    . PARTIAL HYSTERECTOMY      Prior to Admission medications   Medication Sig Start Date End Date Taking? Authorizing Provider  diclofenac sodium (VOLTAREN) 1 % GEL Apply 4 g topically 4 (four) times daily as needed (pain). 04/05/18   Merrily Brittle, MD  HYDROcodone-acetaminophen (NORCO) 5-325 MG tablet Take 1 tablet by mouth every 6 (six) hours as needed for up to 7 doses for severe pain. 04/05/18   Merrily Brittle, MD  sulfamethoxazole-trimethoprim  (BACTRIM DS) 800-160 MG tablet Take 1 tablet by mouth 2 (two) times daily. 11/30/15   Houston Siren, MD    Allergies Patient has no known allergies.  Family History  Problem Relation Age of Onset  . CAD Mother   . Rectal cancer Mother   . CAD Father   . CAD Sister     Social History Social History   Tobacco Use  . Smoking status: Current Every Day Smoker  . Smokeless tobacco: Never Used  Substance Use Topics  . Alcohol use: No  . Drug use: No    Review of Systems Constitutional: No fever/chills Eyes: No visual changes. ENT: Positive for neck pain Cardiovascular: Denies chest pain. Respiratory: Denies shortness of breath. Gastrointestinal: No abdominal pain.  No nausea, no vomiting.  No diarrhea.  No constipation. Genitourinary: Negative for dysuria. Musculoskeletal: Negative for back pain. Skin: Negative for rash. Neurological: Negative for headaches, focal weakness or numbness.   ____________________________________________   PHYSICAL EXAM:  VITAL SIGNS: ED Triage Vitals  Enc Vitals Group     BP 04/05/18 0103 (!) 156/60     Pulse Rate 04/05/18 0103 73     Resp 04/05/18 0103 16     Temp 04/05/18 0103 97.9 F (36.6 C)     Temp Source 04/05/18 0103 Oral     SpO2 04/05/18 0103 98 %     Weight 04/05/18 0052 102 lb (46.3 kg)     Height 04/05/18 0052 4\' 11"  (1.499 m)  Head Circumference --      Peak Flow --      Pain Score 04/05/18 0052 9     Pain Loc --      Pain Edu? --      Excl. in GC? --     Constitutional: Alert and oriented x4 appears obviously uncomfortable holding the back of her neck Eyes: PERRL EOMI. midrange and brisk Head: Atraumatic. Nose: No congestion/rhinnorhea. Mouth/Throat: No trismus Neck: No stridor.  Some muscle spasm left greater than right neck Cardiovascular: Normal rate, regular rhythm. Grossly normal heart sounds.  Good peripheral circulation. Respiratory: Normal respiratory effort.  No retractions. Lungs CTAB and moving  good air Gastrointestinal: Soft nontender Musculoskeletal: No lower extremity edema   Neurologic:  Normal speech and language. No gross focal neurologic deficits are appreciated. Skin:  Skin is warm, dry and intact. No rash noted. Psychiatric: Mood and affect are normal. Speech and behavior are normal.    ____________________________________________   DIFFERENTIAL includes but not limited to  Vertebral artery dissection, cervical artery dissection, radicular pain, muscle spasm ____________________________________________   LABS (all labs ordered are listed, but only abnormal results are displayed)  Labs Reviewed  CBC WITH DIFFERENTIAL/PLATELET - Abnormal; Notable for the following components:      Result Value   RDW 14.8 (*)    Neutro Abs 6.6 (*)    All other components within normal limits  COMPREHENSIVE METABOLIC PANEL    Lab work reviewed by me with no acute disease __________________________________________  EKG   ____________________________________________  RADIOLOGY  CT angiogram of the neck reviewed by me with no acute disease ____________________________________________   PROCEDURES  Procedure(s) performed: no  Procedures  Critical Care performed: no  ____________________________________________   INITIAL IMPRESSION / ASSESSMENT AND PLAN / ED COURSE  Pertinent labs & imaging results that were available during my care of the patient were reviewed by me and considered in my medical decision making (see chart for details).   As part of my medical decision making, I reviewed the following data within the electronic MEDICAL RECORD NUMBER History obtained from family if available, nursing notes, old chart and ekg, as well as notes from prior ED visits.  The patient arrives with pain that is most likely musculoskeletal in etiology however she was recently manipulated by chiropractor with an acute exacerbation raising concern for vascular etiology of her  worsening pain.  Given IV Toradol with some improvement.  She is driving home and cannot get a ride so I cannot give her any opioids or benzodiazepines.  Lab work and CT scan are pending.  Fortunately the patient's CT scan is reassuring.  I will prescribe her a short course of hydrocodone as well as Voltaren gel.  Strict return precautions have been given.  She remains neuro intact.      ____________________________________________   FINAL CLINICAL IMPRESSION(S) / ED DIAGNOSES  Final diagnoses:  Strain of neck muscle, initial encounter      NEW MEDICATIONS STARTED DURING THIS VISIT:  Discharge Medication List as of 04/05/2018  4:12 AM    START taking these medications   Details  diclofenac sodium (VOLTAREN) 1 % GEL Apply 4 g topically 4 (four) times daily as needed (pain)., Starting Fri 04/05/2018, Print         Note:  This document was prepared using Dragon voice recognition software and may include unintentional dictation errors.     Merrily Brittleifenbark, Holleigh Crihfield, MD 04/07/18 2249

## 2018-04-05 NOTE — ED Triage Notes (Signed)
Patient ambulatory to triage with steady gait, without difficulty or distress noted; pt reports having neck pain with head movement"for awhile"; seen by chiropractor for such and has had adjustments; st lifts a lot of weight at work and feels like a pulled muscle

## 2018-09-03 IMAGING — CT CT ANGIO NECK
2 of 7 series · 8 of 33 positions shown · IV contrast (APPLIED)
Comparison: 10/13/2013 CT cervical spine.  10/30/2013 PET-CT.

CLINICAL DATA: 57 y/o F; neck pain with head movement. History of
adjustments with a chiropractor and heavy lifting at work.

EXAM:
CT ANGIOGRAPHY NECK
TECHNIQUE: Multidetector CT imaging of the neck was performed using the
standard protocol during bolus administration of intravenous
contrast. Multiplanar CT image reconstructions and MIPs were
obtained to evaluate the vascular anatomy. Carotid stenosis
measurements (when applicable) are obtained utilizing NASCET
criteria, using the distal internal carotid diameter as the
denominator.
CONTRAST:  75mL 4EUYBP-3W5 IOPAMIDOL (4EUYBP-3W5) INJECTION 76%

[Series 4: cta neck · axial · 0.42mm/px · z∈[-233,-155]mm · 2 of 118 slices shown]
[im 40/118  soft-tissue]
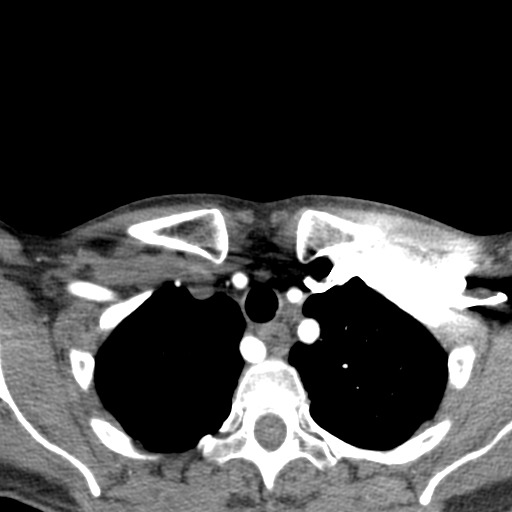
[im 79/118  soft-tissue]
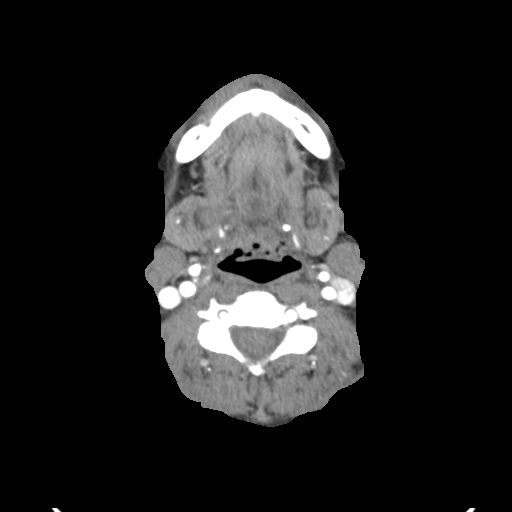

[Series 6: ax thin · axial · 0.38mm/px · z∈[-280,-110]mm · 6 of 239 slices shown]
[im 35/239  soft-tissue]
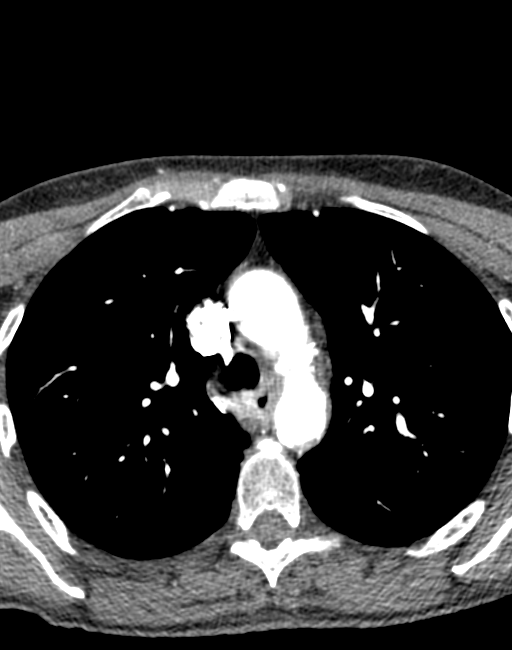
[im 69/239  bone]
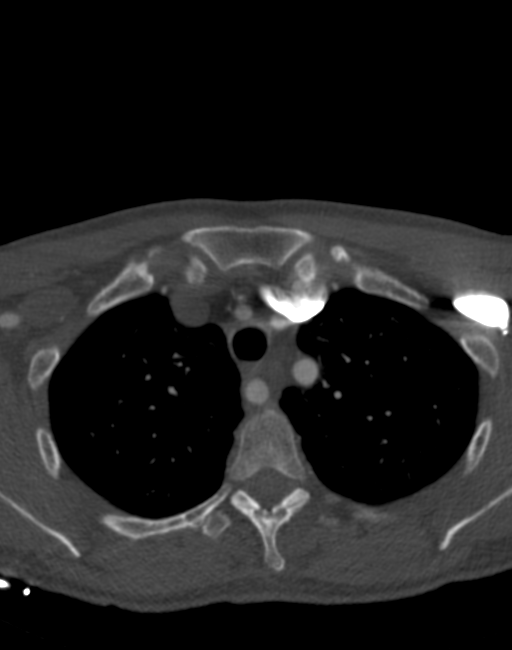
[im 103/239  soft-tissue]
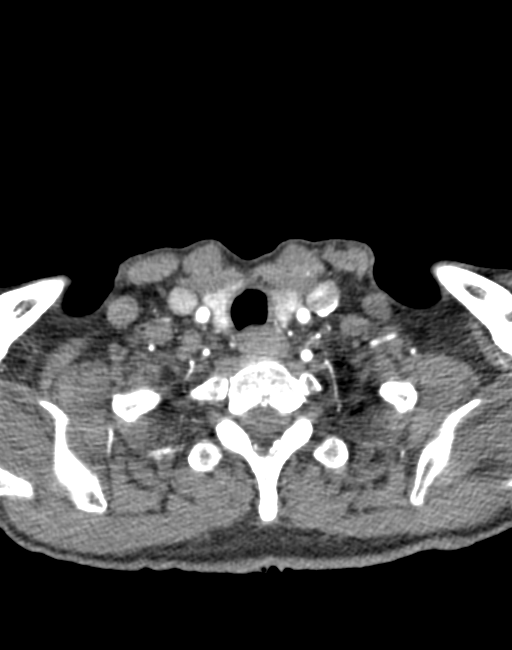
[im 137/239  bone]
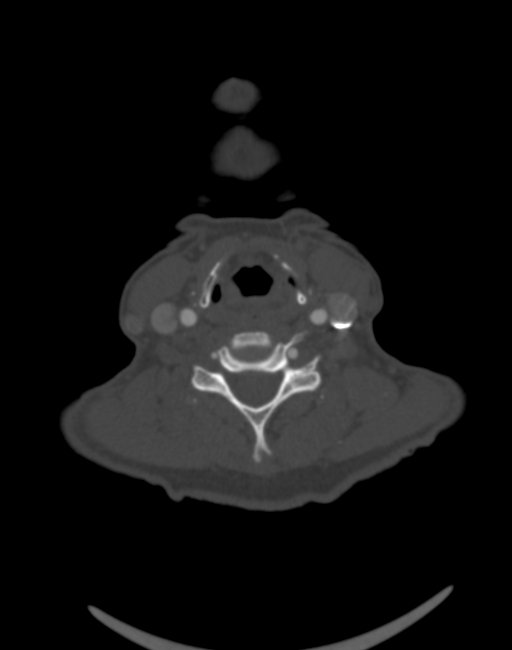
[im 171/239  soft-tissue]
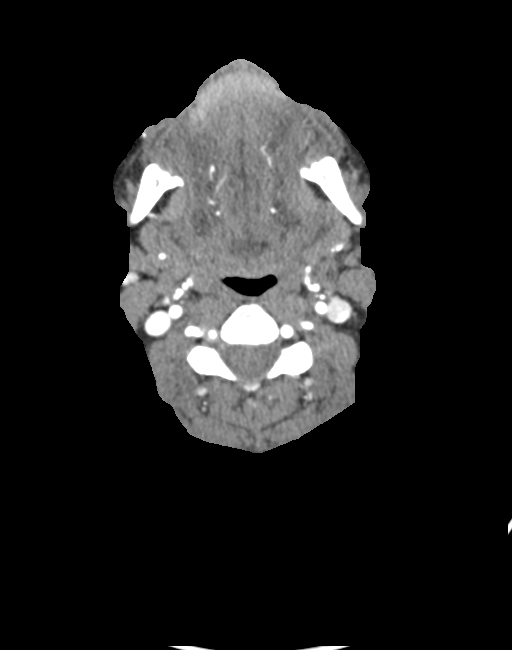
[im 205/239  bone]
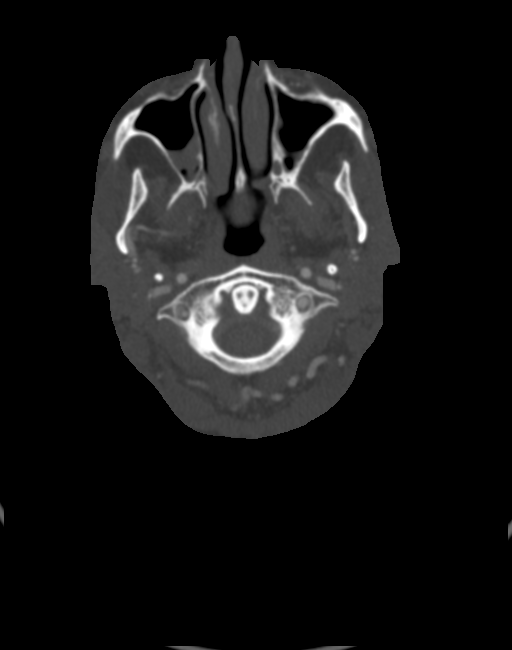

[8 of 33 positions shown; findings below may reference images not displayed]

FINDINGS: Aortic arch: Aberrant right subclavian artery. Mild calcific
atherosclerosis of the aortic arch without significant stenosis of
great vessel origins. No dissection, aneurysm, or findings of
vasculitis.

Right carotid system: No evidence of dissection, stenosis (50% or
greater) or occlusion. Mild fibrofatty plaque without significant
stenosis.

Left carotid system: No evidence of dissection, stenosis (50% or
greater) or occlusion. Mild fibrofatty plaque without significant
stenosis.

Vertebral arteries: Left dominant. No evidence of dissection,
stenosis (50% or greater) or occlusion.

Skeleton: Negative.

Other neck: Negative.

Upper chest: Mild centrilobular emphysema of the lung apices.
IMPRESSION: 1. Patent carotid and vertebral arteries. No dissection, aneurysm,
or hemodynamically significant stenosis by NASCET criteria.
2. Aberrant right subclavian artery.
3. Mild calcific atherosclerosis of the aortic arch and great vessel
origins. Mild non stenotic fibrofatty plaque of the carotid systems.
4. Mild centrilobular emphysema of the lungs.
5. No acute fracture or malalignment of the cervical spine
identified.

By: Jourdan Sias M.D.

## 2019-11-02 ENCOUNTER — Ambulatory Visit: Payer: Self-pay | Attending: Internal Medicine

## 2019-11-02 DIAGNOSIS — Z23 Encounter for immunization: Secondary | ICD-10-CM

## 2019-11-02 NOTE — Progress Notes (Signed)
   Covid-19 Vaccination Clinic  Name:  Taylor Larsen    MRN: [id number] DOB: Mar 16, 1961  11/02/2019  [name] was observed post Covid-19 immunization for 15 minutes without incident. She was provided with Vaccine Information Sheet and instruction to access the V-Safe system.   [name] was instructed to call 911 with any severe reactions post vaccine: Marland Kitchen Difficulty breathing  . Swelling of face and throat  . A fast heartbeat  . A bad rash all over body  . Dizziness and weakness   Immunizations Administered    Name Date Dose VIS Date Route   Pfizer COVID-19 Vaccine 11/02/2019 11:03 AM 0.3 mL 07/18/2019 Intramuscular   Manufacturer: [employer], [employer]   Lot: [id number]   NDC: [address]-1

## 2019-11-25 ENCOUNTER — Ambulatory Visit: Payer: Self-pay

## 2019-12-04 ENCOUNTER — Ambulatory Visit: Payer: Self-pay | Attending: Internal Medicine

## 2019-12-04 DIAGNOSIS — Z23 Encounter for immunization: Secondary | ICD-10-CM

## 2019-12-04 NOTE — Progress Notes (Signed)
   Covid-19 Vaccination Clinic  Name:  Taylor Larsen    MRN: [id number] DOB: 1960/12/18  12/04/2019  [name] was observed post Covid-19 immunization for 15 inutes without incident. She was provided with Vaccine Information Sheet and instruction to access the V-Safe system.   [name] was instructed to call 911 with any severe reactions post vaccine: Marland Kitchen Difficulty breathing  . Swelling of face and throat  . A fast heartbeat  . A bad rash all over body  . Dizziness and weakness   Immunizations Administered    Name Date Dose VIS Date Route   Pfizer COVID-19 Vaccine 12/04/2019  9:15 AM 0.3 mL 10/01/2018 Intramuscular   Manufacturer: [employer], [employer]   Lot: [id number]   NDC: [address]-3

## 2022-10-25 NOTE — Progress Notes (Deleted)
New patient visit   Patient: Taylor Larsen   DOB: Jan 18, 1961   62 y.o. Female  MRN: [id number] Visit Date: 10/26/2022  Today's healthcare provider: Eulis Foster, MD   No chief complaint on file.  Subjective    Taylor Larsen is a 62 y.o. female who presents today as a new patient to establish care.  HPI  ***  Past Medical History:  Diagnosis Date   Abscess    Anxiety    COPD (chronic obstructive pulmonary disease) (HCC)    Depression    Heart palpitations    Past Surgical History:  Procedure Laterality Date   CESAREAN SECTION     PARTIAL HYSTERECTOMY     Family Status  Relation Name Status   Mother  Deceased   Father  Deceased   Sister  (Not Specified)   Family History  Problem Relation Age of Onset   CAD Mother    Rectal cancer Mother    CAD Father    CAD Sister    Social History   Socioeconomic History   Marital status: Single    Spouse name: Not on file   Number of children: Not on file   Years of education: Not on file   Highest education level: Not on file  Occupational History   Not on file  Tobacco Use   Smoking status: Every Day   Smokeless tobacco: Never  Substance and Sexual Activity   Alcohol use: No   Drug use: No   Sexual activity: Not on file  Other Topics Concern   Not on file  Social History Narrative   Not on file   Social Determinants of Health   Financial Resource Strain: Not on file  Food Insecurity: Not on file  Transportation Needs: Not on file  Physical Activity: Not on file  Stress: Not on file  Social Connections: Not on file   Outpatient Medications Prior to Visit  Medication Sig   diclofenac sodium (VOLTAREN) 1 % GEL Apply 4 g topically 4 (four) times daily as needed (pain).   HYDROcodone-acetaminophen (NORCO) 5-325 MG tablet Take 1 tablet by mouth every 6 (six) hours as needed for up to 7 doses for severe pain.   sulfamethoxazole-trimethoprim (BACTRIM DS) 800-160 MG tablet Take 1 tablet by mouth 2  (two) times daily.   No facility-administered medications prior to visit.   No Known Allergies  Immunization History  Administered Date(s) Administered   PFIZER(Purple Top)SARS-COV-2 Vaccination 11/02/2019, 12/04/2019    Health Maintenance  Topic Date Due   HIV Screening  Never done   Hepatitis C Screening  Never done   DTaP/Tdap/Td (1 - Tdap) Never done   PAP SMEAR-Modifier  Never done   COLONOSCOPY (Pts 45-29yrs Insurance coverage will need to be confirmed)  Never done   MAMMOGRAM  Never done   Zoster Vaccines- Shingrix (1 of 2) Never done   INFLUENZA VACCINE  Never done   COVID-19 Vaccine (3 - 2023-24 season) 04/07/2022   HPV VACCINES  Aged Out    Patient Care Team: Patient, No Pcp Per as PCP - General (General Practice)  Review of Systems  {Labs  Heme  Chem  Endocrine  Serology  Results Review (optional):23779}   Objective    There were no vitals taken for this visit. {Show previous vital signs (optional):23777}  Physical Exam ***  Depression Screen     No data to display         No results found for any  visits on 10/26/22.  Assessment & Plan     ***  No follow-ups on file.     {provider attestation***:1}   Eulis Foster, MD  [facility] [phone] (phone) [phone] (fax)  [place]

## 2022-10-26 ENCOUNTER — Ambulatory Visit: Payer: Self-pay | Admitting: Family Medicine

## 2022-12-04 NOTE — Progress Notes (Unsigned)
New patient visit   Patient: Taylor Larsen   DOB: March 05, 1961   62 y.o. Female  MRN: [id number] Visit Date: 12/05/2022  Today's healthcare provider: Romero Liner, DO   Chief Complaint  Patient presents with   Hypertension   Subjective    Taylor Larsen is a 62 y.o. female who presents today as a new patient to establish care.  HPI HPI     Hypertension   This is a new problem.  Recent episode started more than a year ago.  The problem is uncontrolled.  Agents associated with hypertension include thyroid hormones.  Anxiety: Present.  Blurred vision: Present.  Chest pain: Absent.  Chest pressure/discomfort: Absent.  Dyspnea: Absent.  Headaches: Present.  Lower extremity edema: Absent.  Orthopnea: Absent.  Palpitations: Present.  Paroxysmal nocturnal dyspnea: Absent.  Syncope: Absent.  The patient exercises every other day.      Last edited by Shelly Bombard, CMA on 12/05/2022  1:24 PM.      Last PAP Smear: >20 years ago Last Colonoscopy: 14 years ago.  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  Patient endorses having not had a primary care provider for more than the past 20 years but has recently been seen intermittently at urgent cares and at the emergency department.  She states that she has consistently been told her blood pressure is very high and she needs to get a primary care provider.  The most recent occurrence was approximately 3 months ago in which she went to an urgent care for evaluation of bilateral ear pain.  She was found to have otitis media bilaterally (L>R) and was given an antibiotic which she has taken and completed.  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  She has had similar problems over the past year.  She reports that, over 1-2 months ago,  she had several episodes of syncope.  She also endorses having occasional rapid heart beat.  Describes blisters forming on right buttocks, which itch then start to burns  Anxiety/depression: She endorses having been hospitalized multiple times beginning around the age of 17 years for depression and SI. 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 At that time, she saw counselors and psychiatrists regularly until she lost her insurance, which occurred over 20 years ago.  She has not had insurance since then and has only seen medical providers if she goes to the ER/urgent care.    She thinks that she has previously been given low-dose Xanax for anxiety and that she had tried Paxil but had to stop because had auditory hallucinations.  She endorses occasionally seeing things which are not there but describes these as objects that appear to pass quickly in her peripheral vision then be gone when she looks.  Patient endorses recent sinus discomfort. No previous concern with environmental allergies.  Smokes up to 1 ppd. Previously tried the patch and gum (had to stop both due to each individually causing palpitations).  She thinks she has already tried wellbutrin.   Past Medical History:  Diagnosis Date   Abscess    Anxiety    COPD (chronic obstructive pulmonary disease) (HCC)    Depression    Heart palpitations    Past  Surgical History:  Procedure Laterality Date   CESAREAN SECTION     PARTIAL HYSTERECTOMY     Family Status  Relation Name Status   Mother  Deceased   Father  Deceased   Sister  (Not Specified)   Family History  Problem Relation Age of Onset   CAD Mother    Rectal cancer Mother    CAD Father    CAD Sister    Social History   Socioeconomic History   Marital status: Single    Spouse name: Not on file   Number of children: Not on file   Years of education: Not on file   Highest education level: Not on file  Occupational History    Not on file  Tobacco Use   Smoking status: Every Day    Packs/day: .5    Types: Cigarettes   Smokeless tobacco: Never  Substance and Sexual Activity   Alcohol use: No   Drug use: No   Sexual activity: Not Currently    Comment: dyspareunia  Other Topics Concern   Not on file  Social History Narrative   Not on file   Social Determinants of Health   Financial Resource Strain: Not on file  Food Insecurity: Not on file  Transportation Needs: Not on file  Physical Activity: Not on file  Stress: Not on file  Social Connections: Not on file   Outpatient Medications Prior to Visit  Medication Sig   [DISCONTINUED] diclofenac sodium (VOLTAREN) 1 % GEL Apply 4 g topically 4 (four) times daily as needed (pain).   [DISCONTINUED] HYDROcodone-acetaminophen (NORCO) 5-325 MG tablet Take 1 tablet by mouth every 6 (six) hours as needed for up to 7 doses for severe pain.   [DISCONTINUED] sulfamethoxazole-trimethoprim (BACTRIM DS) 800-160 MG tablet Take 1 tablet by mouth 2 (two) times daily.   No facility-administered medications prior to visit.   No Known Allergies  Immunization History  Administered Date(s) Administered   PFIZER(Purple Top)SARS-COV-2 Vaccination 11/02/2019, 12/04/2019   Tdap 12/05/2022    Health Maintenance  Topic Date Due   HIV Screening  Never done   Hepatitis C Screening  Never done   PAP SMEAR-Modifier  Never done   COLONOSCOPY (Pts 45-54yrs Insurance coverage will need to be confirmed)  Never done   MAMMOGRAM  Never done   Zoster Vaccines- Shingrix (1 of 2) Never done   COVID-19 Vaccine (3 - 2023-24 season) 04/07/2022   INFLUENZA VACCINE  03/08/2023   DTaP/Tdap/Td (2 - Td or Tdap) 12/04/2032   HPV VACCINES  Aged Out    Patient Care Team: Levy Wellman, Monico Blitz, DO as PCP - General (Family Medicine)  Review of Systems  Constitutional:  Positive for fatigue. Negative for unexpected weight change.  HENT:  Positive for sinus pressure.        Minor residual  discomfort in left ear, gradually improving  Respiratory:  Negative for shortness of breath.   Cardiovascular:  Positive for palpitations. Negative for chest pain and leg swelling.  Gastrointestinal:  Negative for abdominal pain.  Genitourinary:  Positive for dyspareunia. Negative for dysuria, flank pain, frequency and hematuria.  Neurological:  Positive for dizziness and syncope. Negative for facial asymmetry and numbness.  Psychiatric/Behavioral:  Negative for hallucinations, self-injury and suicidal ideas. The patient is nervous/anxious.        Objective    BP (!) 168/73 (BP Location: Right Arm, Patient Position: Sitting, Cuff Size: Normal)   Pulse 73   Ht 4\' 11"  (1.499 m)   Wt  98 lb (44.5 kg)   SpO2 99%   BMI 19.79 kg/m    Physical Exam Constitutional:      General: She is not in acute distress.    Appearance: Normal appearance. She is normal weight. She is not ill-appearing.  HENT:     Head: Normocephalic and atraumatic.     Right Ear: Tympanic membrane, ear canal and external ear normal.     Left Ear: Ear canal and external ear normal. A middle ear effusion is present.     Ears:     Comments: Mild effusion, likely still resolving from previous otitis media.    Mouth/Throat:     Mouth: Mucous membranes are moist.     Pharynx: No oropharyngeal exudate or posterior oropharyngeal erythema.  Eyes:     General: No scleral icterus.    Extraocular Movements: Extraocular movements intact.     Conjunctiva/sclera: Conjunctivae normal.  Cardiovascular:     Rate and Rhythm: Regular rhythm. Bradycardia present.     Pulses: Normal pulses.  Pulmonary:     Effort: Pulmonary effort is normal.     Breath sounds: Normal breath sounds.  Abdominal:     General: Bowel sounds are normal. There is no distension.     Palpations: Abdomen is soft. There is no mass.     Tenderness: There is no abdominal tenderness. There is no guarding.  Musculoskeletal:        General: No swelling or  deformity.  Skin:    General: Skin is warm and dry.     Coloration: Skin is not jaundiced or pale.     Findings: No bruising, erythema, lesion or rash.  Neurological:     General: No focal deficit present.     Mental Status: She is alert and oriented to person, place, and time.     Sensory: No sensory deficit.     Motor: No weakness.     Gait: Gait normal.  Psychiatric:        Behavior: Behavior normal.        Thought Content: Thought content normal.        Judgment: Judgment normal.    Depression Screen    12/05/2022    1:26 PM  PHQ 2/9 Scores  PHQ - 2 Score 6  PHQ- 9 Score 12   No results found for any visits on 12/05/22.  Assessment & Plan      Problem List Items Addressed This Visit     HTN (hypertension) - Primary    Patient's blood pressure has been uncontrolled over multiple visits to urgent cares and emergency departments, as well as today on two separate blood pressure measurements.  Given the chronicity of this problem and how elevated her blood pressures here have been, we will go ahead and start patient on low-dose antihypertensive, valsartan, as noted below.  Will also check blood work as noted, including CMP, lipid panel and TSH.  Patient will return in 2 weeks for recheck and potential adjustment of medication.      Relevant Medications   valsartan (DIOVAN) 40 MG tablet   Other Relevant Orders   Lipid Profile   TSH + free T4   Nicotine dependence with current use    Patient endorses wanting to quit but had not been able to tolerate various methods in the past as noted in the HPI. Ran out of time to address non-pharmaceutical methods, such as the tobacco cessation program, during this visit; will need to address again at  next visit.      Mixed anxiety and depressive disorder    Patient has severe anxiety and moderate depression; she is amenable to starting an anxiolytic/antidepressant.  Prescribed low-dose escitalopram as noted below to address both.  Patient  will follow-up in 2 weeks to check in and see how she is doing on it.  Emphasized that the full effect is not usually seen until between 4 and 6 weeks.  Of note, patient's mood disorder questionnaire is borderline, so will progress slowly and may consider referral to psychiatry at next visit.      Relevant Medications   escitalopram (LEXAPRO) 10 MG tablet   Other Visit Diagnoses     Pre-syncope       Relevant Medications   valsartan (DIOVAN) 40 MG tablet   Other Relevant Orders   Comprehensive Metabolic Panel (CMET)   EKG 12-Lead   Ambulatory referral to Cardiology   CBC w/Diff/Platelet   Annual physical exam       Screening for HIV (human immunodeficiency virus)       Relevant Orders   HIV Antibody (routine testing w rflx)   Encounter for HCV screening test for low risk patient       Relevant Orders   Hepatitis C antibody   Encounter for screening colonoscopy       Relevant Orders   Ambulatory referral to Gastroenterology   Screening mammogram for breast cancer       Relevant Orders   MM 3D SCREENING MAMMOGRAM BILATERAL BREAST   Vaccine for diphtheria-tetanus-pertussis, combined       Relevant Orders   Tdap vaccine greater than or equal to 7yo IM (Completed)   ECG abnormal       Relevant Orders   ECHOCARDIOGRAM COMPLETE   Ambulatory referral to Cardiology      Pre-syncope: Patient's ECG was abnormal, showing sinus bradycardia with prolonged PR interval and concern for bilateral atrial enlargement; rate 59, PR interval 132, QRS 90, QT/Qtc 402/397.  Ordered echocardiogram and referred patient to cardiology.   Return in about 2 weeks (around 12/19/2022) for PAP smear, shingrix, blood pressure re-evaluation and anxiety/depression re-evaluation.     Plan: Patient will follow-up in 2 weeks for PAP smear, shingrix, blood pressure re-evaluation and anxiety/depression re-evaluation.    The entirety of the information documented in the History of Present Illness, Review of  Systems and Physical Exam were personally obtained by me. Portions of this information were initially documented by the CMA, Irene Pap, and reviewed by me for thoroughness and accuracy.     Sherlyn Hay, DO  [facility] [phone] (phone) [phone] (fax)  [facility]

## 2022-12-05 ENCOUNTER — Ambulatory Visit (INDEPENDENT_AMBULATORY_CARE_PROVIDER_SITE_OTHER): Payer: 59 | Admitting: Family Medicine

## 2022-12-05 ENCOUNTER — Encounter: Payer: Self-pay | Admitting: Family Medicine

## 2022-12-05 VITALS — BP 168/73 | HR 73 | Ht 59.0 in | Wt 98.0 lb

## 2022-12-05 DIAGNOSIS — R9431 Abnormal electrocardiogram [ECG] [EKG]: Secondary | ICD-10-CM

## 2022-12-05 DIAGNOSIS — Z1231 Encounter for screening mammogram for malignant neoplasm of breast: Secondary | ICD-10-CM | POA: Diagnosis not present

## 2022-12-05 DIAGNOSIS — Z23 Encounter for immunization: Secondary | ICD-10-CM | POA: Diagnosis not present

## 2022-12-05 DIAGNOSIS — F418 Other specified anxiety disorders: Secondary | ICD-10-CM

## 2022-12-05 DIAGNOSIS — Z1211 Encounter for screening for malignant neoplasm of colon: Secondary | ICD-10-CM | POA: Diagnosis not present

## 2022-12-05 DIAGNOSIS — Z0000 Encounter for general adult medical examination without abnormal findings: Secondary | ICD-10-CM

## 2022-12-05 DIAGNOSIS — F17200 Nicotine dependence, unspecified, uncomplicated: Secondary | ICD-10-CM

## 2022-12-05 DIAGNOSIS — I10 Essential (primary) hypertension: Secondary | ICD-10-CM | POA: Diagnosis not present

## 2022-12-05 DIAGNOSIS — Z1159 Encounter for screening for other viral diseases: Secondary | ICD-10-CM

## 2022-12-05 DIAGNOSIS — R55 Syncope and collapse: Secondary | ICD-10-CM | POA: Diagnosis not present

## 2022-12-05 DIAGNOSIS — Z114 Encounter for screening for human immunodeficiency virus [HIV]: Secondary | ICD-10-CM | POA: Diagnosis not present

## 2022-12-05 MED ORDER — ESCITALOPRAM OXALATE 10 MG PO TABS: 5.0000 mg | ORAL_TABLET | Freq: Every day | ORAL | 0 refills | Status: DC

## 2022-12-05 MED ORDER — VALSARTAN 40 MG PO TABS: 40.0000 mg | ORAL_TABLET | Freq: Every day | ORAL | 0 refills | Status: DC

## 2022-12-05 NOTE — Assessment & Plan Note (Addendum)
Patient endorses wanting to quit but had not been able to tolerate various methods in the past as noted in the HPI. Ran out of time to address non-pharmaceutical methods, such as the tobacco cessation program, during this visit; will need to address again at next visit.

## 2022-12-05 NOTE — Patient Instructions (Addendum)
  Check your blood pressure once daily, and any time you have concerning symptoms like headache, chest pain, dizziness, shortness of breath, or vision changes.   Our goal is less than 130/80.  To appropriately check your blood pressure, make sure you do the following:  1) Avoid caffeine, exercise, or tobacco products for 30 minutes before checking. Empty your bladder. 2) Sit with your back supported in a flat-backed chair. Rest your arm on something flat (arm of the chair, table, etc). 3) Sit still with your feet flat on the floor, resting, for at least 5 minutes.  4) Check your blood pressure. Take 1-2 readings.  5) Write down these readings and bring with you to any provider appointments.  Bring your home blood pressure machine with you to a provider's office for accuracy comparison at least once a year.   Make sure you take your blood pressure medications before you come to any office visit, even if you were asked to fast for labs.   _______________________________________________________________________  Life's Simple 7   From the American Heart Association https://www.davila.com/  Manage Blood Pressure High blood pressure is a major risk factor for heart disease and stroke. When your blood pressure stays within healthy ranges, you reduce the strain on your heart, arteries, and kidneys which keeps you healthier longer. Learn how to manage your blood pressure. https://www.ward.com/  Control Cholesterol High cholesterol contributes to plaque, which can clog arteries and lead to heart disease and stroke. When you control your cholesterol, you are giving your arteries their best chance to remain clear of blockages. Learn how to control your cholesterol. http://www.cox-reed.biz/  Reduce Blood Sugar Most of the food we eat is turned into glucose (or blood sugar)  that our bodies use for energy. Over time, high levels of blood sugar can damage your heart, kidneys, eyes and nerves. Learn how to reduce your blood sugar. LittleRockVet.fi  Get Active Living an active life is one of the most rewarding gifts you can give yourself and those you love. Simply put, daily physical activity increases your length and quality of life. Learn how to get active and move more. UGLive.com.cy  Eat Better A healthy diet is one of your best weapons for fighting cardiovascular disease. When you eat a heart-healthy diet, you improve your chances for feeling good and staying healthy - for life! Learn how to eat better. https://sampson.info/  Lose Weight <- LEFT IN DOCUMENT FOR REFERENCE ONLY; your weight is good. When you shed extra fat and unnecessary pounds, you reduce the burden on your heart, lungs, blood vessels and skeleton. You give yourself the gift of active living, you lower your blood pressure and you help yourself feel better, too. Learn how to lose or manage weight. TheaterBlogging.ch  Stop Smoking Cigarette smokers have a higher risk of developing cardiovascular disease. If you smoke, quitting is the best thing you can do for your health. Learn how to stop smoking. WormTrap.com.br

## 2022-12-05 NOTE — Assessment & Plan Note (Addendum)
Patient's blood pressure has been uncontrolled over multiple visits to urgent cares and emergency departments, as well as today on two separate blood pressure measurements.  Given the chronicity of this problem and how elevated her blood pressures here have been, we will go ahead and start patient on low-dose antihypertensive, valsartan, as noted below.  Will also check blood work as noted, including CMP, lipid panel and TSH.  Patient will return in 2 weeks for recheck and potential adjustment of medication.

## 2022-12-05 NOTE — Assessment & Plan Note (Addendum)
Patient has severe anxiety and moderate depression; she is amenable to starting an anxiolytic/antidepressant.  Prescribed low-dose escitalopram as noted below to address both.  Patient will follow-up in 2 weeks to check in and see how she is doing on it.  Emphasized that the full effect is not usually seen until between 4 and 6 weeks.  Of note, patient's mood disorder questionnaire is borderline, so will progress slowly and may consider referral to psychiatry at next visit.

## 2022-12-06 NOTE — Addendum Note (Signed)
Addended by: Jacquenette Shone on: 12/06/2022 07:52 AM   Modules accepted: Orders

## 2022-12-18 ENCOUNTER — Encounter: Payer: Self-pay | Admitting: *Deleted

## 2022-12-18 NOTE — Progress Notes (Deleted)
I,Sha'taria Ioma Chismar,acting as a Neurosurgeon for [employer], DO.,have documented all relevant documentation on the behalf of [employer], DO,as directed by  [employer], DO while in the presence of Sherlyn Hay, DO.   Established patient visit   Patient: Taylor Larsen   DOB: August 04, 1961   62 y.o. Female  MRN: [id number] Visit Date: 12/19/2022  Today's healthcare provider: Sherlyn Hay, DO   No chief complaint on file.  Subjective    HPI  -Labs were not completed after last visit. -Shingles Vaccine: -Tetanus Vaccine: -Colonoscopy: GI referral placed 12/05/22 Hypertension, follow-up  BP Readings from Last 3 Encounters:  12/05/22 (!) 168/73  04/05/18 (!) 156/60  11/30/15 (!) 98/58   Wt Readings from Last 3 Encounters:  12/05/22 98 lb (44.5 kg)  04/05/18 102 lb (46.3 kg)  11/30/15 107 lb (48.5 kg)     She was last seen for hypertension 2 weeks ago.  BP at that visit was 168/73. Management since that visit includes start patient on low-dose antihypertensive, valsartan .  She reports {excellent/good/fair/poor:19665} compliance with treatment. She {is/is not:9024} having side effects. {document side effects if present:1} She is following a {diet:21022986} diet. She {is/is not:9024} exercising. She {does/does not:200015} smoke.  Use of agents associated with hypertension: {bp agents assoc with hypertension:511::"none"}.   Outside blood pressures are {***enter patient reported home BP readings, or 'not being checked':1}. Symptoms: {Yes/No:20286} chest pain {Yes/No:20286} chest pressure  {Yes/No:20286} palpitations {Yes/No:20286} syncope  {Yes/No:20286} dyspnea {Yes/No:20286} orthopnea  {Yes/No:20286} paroxysmal nocturnal dyspnea {Yes/No:20286} lower extremity edema   Pertinent labs No results found for: "CHOL", "HDL", "LDLCALC", "LDLDIRECT", "TRIG", "CHOLHDL" Lab Results  Component Value Date   NA 145 04/05/2018   K 3.9 04/05/2018   CREATININE 0.53 04/05/2018    GFRNONAA >60 04/05/2018   GLUCOSE 91 04/05/2018     The ASCVD Risk score (Arnett DK, et al., 2019) failed to calculate for the following reasons:   Cannot find a previous HDL lab   Cannot find a previous total cholesterol lab ---------------------------------------------------------------------------------------------------  Anxiety, Follow-up  She was last seen for anxiety 2 weeks ago. Changes made at last visit include prescribed low-dose escitalopram .   She reports {excellent/good/fair/poor:19665} compliance with treatment. She reports {good/fair/poor:18685} tolerance of treatment. She {is/is not:21021397} having side effects. {document side effects if present:1}  She feels her anxiety is {Desc; severity:60313} and {improved/worse/unchanged:3041574} since last visit.  Symptoms: {Yes/No:20286} chest pain {Yes/No:20286} difficulty concentrating  {Yes/No:20286} dizziness {Yes/No:20286} fatigue  {Yes/No:20286} feelings of losing control {Yes/No:20286} insomnia  {Yes/No:20286} irritable {Yes/No:20286} palpitations  {Yes/No:20286} panic attacks {Yes/No:20286} racing thoughts  {Yes/No:20286} shortness of breath {Yes/No:20286} sweating  {Yes/No:20286} tremors/shakes    GAD-7 Results    12/05/2022    1:27 PM  GAD-7 Generalized Anxiety Disorder Screening Tool  1. Feeling Nervous, Anxious, or on Edge 3  2. Not Being Able to Stop or Control Worrying 3  3. Worrying Too Much About Different Things 3  4. Trouble Relaxing 3  5. Being So Restless it's Hard To Sit Still 3  6. Becoming Easily Annoyed or Irritable 1  7. Feeling Afraid As If Something Awful Might Happen 2  Total GAD-7 Score 18  Difficulty At Work, Home, or Getting  Along With Others? Not difficult at all   Depression, Follow-up  Current symptoms include: {Symptoms; depression:1002} She feels she is {improved/worse/unchanged:3041574} since last visit.  PHQ-9 Scores    12/05/2022    1:26 PM  Depression screen [facility] 2/9  Decreased Interest 3  Down, Depressed, Hopeless 3  PHQ - 2 Score 6  Altered sleeping 3  Tired, decreased energy 0  Change in appetite 3  Feeling bad or failure about yourself  0  Trouble concentrating 0  Moving slowly or fidgety/restless 0  Suicidal thoughts 0  PHQ-9 Score 12  Difficult doing work/chores Not difficult at all  ---------------------------------------------------------------------------------------------------   Medications: Outpatient Medications Prior to Visit  Medication Sig   escitalopram (LEXAPRO) 10 MG tablet Take 0.5 tablets (5 mg total) by mouth daily.   valsartan (DIOVAN) 40 MG tablet Take 1 tablet (40 mg total) by mouth daily.   No facility-administered medications prior to visit.    Review of Systems  {Labs  Heme  Chem  Endocrine  Serology  Results Review (optional):23779}   Objective    There were no vitals taken for this visit. {Show previous vital signs (optional):23777}  Physical Exam  ***  No results found for any visits on 12/19/22.  Assessment & Plan     ***  No follow-ups on file.      {provider attestation***:1}   Sherlyn Hay, DO  [facility] [phone] (phone) [phone] (fax)  [facility]

## 2022-12-19 ENCOUNTER — Ambulatory Visit: Payer: 59 | Admitting: Family Medicine

## 2023-01-08 NOTE — Progress Notes (Unsigned)
      Established patient visit   Patient: Taylor Larsen   DOB: [birth year]   62 y.o. Female  MRN: [id number] Visit Date: 01/09/2023  Today's healthcare provider: Sherlyn Hay, DO   No chief complaint on file.  Subjective    HPI  Hypertension, follow-up  BP Readings from Last 3 Encounters:  12/05/22 (!) 168/73  04/05/18 (!) 156/60  11/30/15 (!) 98/58   Wt Readings from Last 3 Encounters:  12/05/22 98 lb (44.5 kg)  04/05/18 102 lb (46.3 kg)  11/30/15 107 lb (48.5 kg)     She was last seen for hypertension 2 months ago.  BP at that visit was as above. Management since that visit includes ***.  She reports {excellent/good/fair/poor:19665} compliance with treatment. She {is/is not:9024} having side effects. {document side effects if present:1} She is following a {diet:21022986} diet. She {is/is not:9024} exercising. She {does/does not:200015} smoke.  Use of agents associated with hypertension: {bp agents assoc with hypertension:511::"none"}.   Outside blood pressures are {***enter patient reported home BP readings, or 'not being checked':1}. Symptoms: {Yes/No:20286} chest pain {Yes/No:20286} chest pressure  {Yes/No:20286} palpitations {Yes/No:20286} syncope  {Yes/No:20286} dyspnea {Yes/No:20286} orthopnea  {Yes/No:20286} paroxysmal nocturnal dyspnea {Yes/No:20286} lower extremity edema   Pertinent labs No results found for: "CHOL", "HDL", "LDLCALC", "LDLDIRECT", "TRIG", "CHOLHDL" Lab Results  Component Value Date   NA 145 04/05/2018   K 3.9 04/05/2018   CREATININE 0.53 04/05/2018   GFRNONAA >60 04/05/2018   GLUCOSE 91 04/05/2018     The ASCVD Risk score (Arnett DK, et al., 2019) failed to calculate for the following reasons:   Cannot find a previous HDL lab   Cannot find a previous total cholesterol lab  ---------------------------------------------------------------------------------------------------   Medications: Outpatient Medications Prior to Visit   Medication Sig   escitalopram (LEXAPRO) 10 MG tablet Take 0.5 tablets (5 mg total) by mouth daily.   valsartan (DIOVAN) 40 MG tablet Take 1 tablet (40 mg total) by mouth daily.   No facility-administered medications prior to visit.    Review of Systems  {Labs  Heme  Chem  Endocrine  Serology  Results Review (optional):23779}   Objective    There were no vitals taken for this visit. {Show previous vital signs (optional):23777}  Physical Exam  ***  No results found for any visits on 01/09/23.  Assessment & Plan     ***  No follow-ups on file.      {provider attestation***:1}   Sherlyn Hay, DO  [facility] [phone] (phone) [phone] (fax)  [facility]

## 2023-01-09 ENCOUNTER — Ambulatory Visit
Admission: RE | Admit: 2023-01-09 | Discharge: 2023-01-09 | Disposition: A | Discharge status: Home / Self Care | Payer: 59 | Attending: Family Medicine | Admitting: Family Medicine

## 2023-01-09 ENCOUNTER — Ambulatory Visit (INDEPENDENT_AMBULATORY_CARE_PROVIDER_SITE_OTHER): Payer: 59 | Admitting: Family Medicine

## 2023-01-09 ENCOUNTER — Encounter: Payer: Self-pay | Admitting: Family Medicine

## 2023-01-09 ENCOUNTER — Ambulatory Visit
Admission: RE | Admit: 2023-01-09 | Discharge: 2023-01-09 | Disposition: A | Discharge status: Home / Self Care | Payer: 59 | Source: Ambulatory Visit | Attending: Family Medicine | Admitting: Family Medicine

## 2023-01-09 VITALS — BP 172/71 | HR 74 | Temp 98.6°F | Wt 100.0 lb

## 2023-01-09 DIAGNOSIS — M25541 Pain in joints of right hand: Secondary | ICD-10-CM | POA: Diagnosis not present

## 2023-01-09 DIAGNOSIS — Z23 Encounter for immunization: Secondary | ICD-10-CM | POA: Diagnosis not present

## 2023-01-09 DIAGNOSIS — Z114 Encounter for screening for human immunodeficiency virus [HIV]: Secondary | ICD-10-CM | POA: Diagnosis not present

## 2023-01-09 DIAGNOSIS — I10 Essential (primary) hypertension: Secondary | ICD-10-CM | POA: Diagnosis not present

## 2023-01-09 DIAGNOSIS — Z5329 Procedure and treatment not carried out because of patient's decision for other reasons: Secondary | ICD-10-CM

## 2023-01-09 DIAGNOSIS — R55 Syncope and collapse: Secondary | ICD-10-CM | POA: Diagnosis not present

## 2023-01-09 DIAGNOSIS — Z1159 Encounter for screening for other viral diseases: Secondary | ICD-10-CM

## 2023-01-09 DIAGNOSIS — M79644 Pain in right finger(s): Secondary | ICD-10-CM | POA: Diagnosis not present

## 2023-01-09 DIAGNOSIS — F418 Other specified anxiety disorders: Secondary | ICD-10-CM | POA: Diagnosis not present

## 2023-01-09 DIAGNOSIS — M25542 Pain in joints of left hand: Secondary | ICD-10-CM | POA: Insufficient documentation

## 2023-01-09 DIAGNOSIS — S62632A Displaced fracture of distal phalanx of right middle finger, initial encounter for closed fracture: Secondary | ICD-10-CM | POA: Diagnosis not present

## 2023-01-09 MED ORDER — VALSARTAN 40 MG PO TABS: 40.0000 mg | ORAL_TABLET | Freq: Every day | ORAL | 0 refills | Status: DC

## 2023-01-09 MED ORDER — ESCITALOPRAM OXALATE 10 MG PO TABS: 10.0000 mg | ORAL_TABLET | Freq: Every day | ORAL | 1 refills | Status: DC

## 2023-01-09 NOTE — Patient Instructions (Addendum)
Escitalopram (for anxiety):  - continue to take a 1/2 tablet for 8 days, then increase to one full tablet.   Return for nurse-visit blood pressure check in 2 weeks.   Return for follow-up in 4-6 weeks.

## 2023-01-10 LAB — CBC WITH DIFFERENTIAL
Basophils Absolute: 0.1 10*3/uL (ref 0.0–0.2)
Basos: 1 %
EOS (ABSOLUTE): 0.1 10*3/uL (ref 0.0–0.4)
Eos: 1 %
Hematocrit: 40.3 % (ref 34.0–46.6)
Hemoglobin: 13.1 g/dL (ref 11.1–15.9)
Immature Grans (Abs): 0 10*3/uL (ref 0.0–0.1)
Immature Granulocytes: 0 %
Lymphocytes Absolute: 2.4 10*3/uL (ref 0.7–3.1)
Lymphs: 29 %
MCH: 29 pg (ref 26.6–33.0)
MCHC: 32.5 g/dL (ref 31.5–35.7)
MCV: 89 fL (ref 79–97)
Monocytes Absolute: 0.5 10*3/uL (ref 0.1–0.9)
Monocytes: 6 %
Neutrophils Absolute: 5.3 10*3/uL (ref 1.4–7.0)
Neutrophils: 63 %
RBC: 4.52 x10E6/uL (ref 3.77–5.28)
RDW: 13 % (ref 11.7–15.4)
WBC: 8.4 10*3/uL (ref 3.4–10.8)

## 2023-01-10 LAB — T4F: T4,Free (Direct): 0.86 ng/dL (ref 0.82–1.77)

## 2023-01-10 LAB — COMPREHENSIVE METABOLIC PANEL
ALT: 11 IU/L (ref 0–32)
AST: 14 IU/L (ref 0–40)
Albumin/Globulin Ratio: 2 (ref 1.2–2.2)
Albumin: 4.5 g/dL (ref 3.8–4.9)
Alkaline Phosphatase: 70 IU/L (ref 44–121)
BUN/Creatinine Ratio: 18 (ref 9–23)
BUN: 13 mg/dL (ref 6–24)
Bilirubin Total: 0.3 mg/dL (ref 0.0–1.2)
CO2: 24 mmol/L (ref 20–29)
Calcium: 9.6 mg/dL (ref 8.7–10.2)
Chloride: 102 mmol/L (ref 96–106)
Creatinine, Ser: 0.71 mg/dL (ref 0.57–1.00)
Globulin, Total: 2.3 g/dL (ref 1.5–4.5)
Glucose: 99 mg/dL (ref 70–99)
Potassium: 4.4 mmol/L (ref 3.5–5.2)
Sodium: 141 mmol/L (ref 134–144)
Total Protein: 6.8 g/dL (ref 6.0–8.5)
eGFR: 98 mL/min/{1.73_m2} (ref >59)

## 2023-01-10 LAB — LIPID PANEL
Chol/HDL Ratio: 3.4 ratio (ref 0.0–4.4)
Cholesterol, Total: 192 mg/dL (ref 100–199)
HDL: 57 mg/dL (ref >39)
LDL Chol Calc (NIH): 117 mg/dL — ABNORMAL HIGH (ref 0–99)
Triglycerides: 101 mg/dL (ref 0–149)
VLDL Cholesterol Cal: 18 mg/dL (ref 5–40)

## 2023-01-10 LAB — HCV INTERPRETATION

## 2023-01-10 LAB — HCV AB W REFLEX TO QUANT PCR: HCV Ab: NONREACTIVE

## 2023-01-10 LAB — HIV ANTIBODY (ROUTINE TESTING W REFLEX): HIV Screen 4th Generation wRfx: NONREACTIVE

## 2023-01-10 LAB — TSH RFX ON ABNORMAL TO FREE T4: TSH: 8.66 u[IU]/mL — ABNORMAL HIGH (ref 0.450–4.500)

## 2023-02-09 ENCOUNTER — Encounter: Payer: Self-pay | Admitting: Family Medicine

## 2023-02-09 ENCOUNTER — Ambulatory Visit (INDEPENDENT_AMBULATORY_CARE_PROVIDER_SITE_OTHER): Payer: 59 | Admitting: Family Medicine

## 2023-02-09 VITALS — BP 173/80 | HR 78 | Temp 98.1°F | Resp 14 | Ht 59.0 in | Wt 98.3 lb

## 2023-02-09 DIAGNOSIS — F411 Generalized anxiety disorder: Secondary | ICD-10-CM | POA: Diagnosis not present

## 2023-02-09 DIAGNOSIS — I10 Essential (primary) hypertension: Secondary | ICD-10-CM

## 2023-02-09 DIAGNOSIS — F410 Panic disorder [episodic paroxysmal anxiety] without agoraphobia: Secondary | ICD-10-CM | POA: Diagnosis not present

## 2023-02-09 DIAGNOSIS — F430 Acute stress reaction: Secondary | ICD-10-CM

## 2023-02-09 DIAGNOSIS — G4700 Insomnia, unspecified: Secondary | ICD-10-CM

## 2023-02-09 MED ORDER — DIAZEPAM 5 MG PO TABS: 5.0000 mg | ORAL_TABLET | Freq: Four times a day (QID) | ORAL | 0 refills | Status: DC | PRN

## 2023-02-09 MED ORDER — QUETIAPINE FUMARATE 25 MG PO TABS: ORAL_TABLET | ORAL | 1 refills | Status: DC

## 2023-02-09 MED ORDER — VALSARTAN-HYDROCHLOROTHIAZIDE 80-12.5 MG PO TABS: 1.0000 | ORAL_TABLET | Freq: Every day | ORAL | 0 refills | Status: DC
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
## 2023-02-09 NOTE — Assessment & Plan Note (Signed)
Chronic, uncontrolled Pt believes that HTN has improved; however, does not have home logs to confirm Recommend titration from 40 to 80 of diovan plus additional 12.5 hydrochlorothiazide Continue 1 month f/u with PCP to assist BP goal 129/79 given likely ASCVD from tobacco use disorder
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
## 2023-02-09 NOTE — Assessment & Plan Note (Signed)
Acute on chronic, palpable anxiety with tearful anxious affect Continue SSRI at previous dose given <6 weeks since start Add PRN medication and referral to CCM to assist with coping strategies

## 2023-02-13 ENCOUNTER — Ambulatory Visit: Payer: 59 | Admitting: Family Medicine

## 2023-03-19 ENCOUNTER — Ambulatory Visit (INDEPENDENT_AMBULATORY_CARE_PROVIDER_SITE_OTHER): Payer: 59 | Admitting: Family Medicine

## 2023-03-19 ENCOUNTER — Encounter: Payer: Self-pay | Admitting: Family Medicine

## 2023-03-19 VITALS — BP 131/85 | HR 85 | Temp 97.9°F | Ht 59.0 in | Wt 95.0 lb

## 2023-03-19 DIAGNOSIS — F430 Acute stress reaction: Secondary | ICD-10-CM | POA: Diagnosis not present

## 2023-03-19 DIAGNOSIS — R251 Tremor, unspecified: Secondary | ICD-10-CM

## 2023-03-19 DIAGNOSIS — I10 Essential (primary) hypertension: Secondary | ICD-10-CM | POA: Diagnosis not present

## 2023-03-19 DIAGNOSIS — F410 Panic disorder [episodic paroxysmal anxiety] without agoraphobia: Secondary | ICD-10-CM | POA: Diagnosis not present

## 2023-03-19 DIAGNOSIS — G4700 Insomnia, unspecified: Secondary | ICD-10-CM | POA: Diagnosis not present

## 2023-03-19 DIAGNOSIS — M79642 Pain in left hand: Secondary | ICD-10-CM | POA: Diagnosis not present

## 2023-03-19 DIAGNOSIS — F418 Other specified anxiety disorders: Secondary | ICD-10-CM

## 2023-03-19 DIAGNOSIS — F411 Generalized anxiety disorder: Secondary | ICD-10-CM

## 2023-03-19 MED ORDER — ESCITALOPRAM OXALATE 20 MG PO TABS
20.0000 mg | ORAL_TABLET | Freq: Every day | ORAL | 0 refills | Status: AC
Start: 2023-03-19 — End: ?

## 2023-03-19 MED ORDER — QUETIAPINE FUMARATE 25 MG PO TABS: ORAL_TABLET | ORAL | 1 refills | Status: DC

## 2023-03-19 MED ORDER — ESCITALOPRAM OXALATE 10 MG PO TABS
10.0000 mg | ORAL_TABLET | Freq: Every day | ORAL | 0 refills | Status: DC
Start: 2023-03-19 — End: 2023-04-16

## 2023-03-19 NOTE — Assessment & Plan Note (Signed)
Patient notes that she has had a couple additional intermittent episodes of waking up panicked at night.  She notes that she did not take her Seroquel every night consistently, as she felt she did not need it each night.

## 2023-03-19 NOTE — Assessment & Plan Note (Signed)
Significantly improved today.  Will continue her valsartan/hydrochlorothiazide 80-12.5 mg daily
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
## 2023-03-19 NOTE — Patient Instructions (Addendum)
Ask for FMLA form from work so we can request limited schedule (6 days per week).  Hand surgeon: Fredderick Erb  [address] [place], [place] [address] Phone: [phone]
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
## 2023-03-19 NOTE — Assessment & Plan Note (Addendum)
-   Patient continues to have significant anxiety.  Will restart her Lexapro at 10 mg daily for 2 weeks, then increase to 20 mg daily and have her follow-up in 4 weeks for reassessment.  Advised her that this is the highest dose of Lexapro.  If she is not experiencing a difference as compared to the 10 mg dose, may consider switching to an alternative medication.  If she has started to have improvement, will continue for an additional 4 weeks and reassess at the 8-week mark.  - Advised her to continue with Seroquel daily at bedtime, rather than as needed, as she had been doing. - Patient will be contacting her work to try to get FMLA forms; I encouraged her to do this via email so that she has written record of her request.  Gave patient a note recommending she return to work with a reduced schedule of a maximum of six days/week.  Patient was concerned that her work may be unwilling to give her the FMLA forms, as they have previously been unwilling to help her access the benefits website, so I gave her information to look for on the benefits website to identify lead management services.  I also advised her to contact [employer] which commonly handles leave management for many companies and inquire whether her company ([employer]) works with them.

## 2023-03-21 ENCOUNTER — Telehealth: Payer: Self-pay | Admitting: *Deleted

## 2023-03-21 NOTE — Progress Notes (Signed)
  Care Coordination  Outreach Note  03/21/2023 Name: Taylor Larsen MRN: [id number] DOB: August 17, 1960   Care Coordination Outreach Attempts: An unsuccessful telephone outreach was attempted today to offer the patient information about available care coordination services.  Follow Up Plan:  Additional outreach attempts will be made to offer the patient care coordination information and services.   Encounter Outcome:  No Answer  Burman Nieves, CCMA Care Coordination Care Guide Direct Dial: [phone]

## 2023-03-22 ENCOUNTER — Telehealth: Payer: Self-pay

## 2023-03-22 NOTE — Telephone Encounter (Signed)
   Telephone encounter was:  Unsuccessful.  03/22/2023 Name: Taylor Larsen MRN: [id number] DOB: 11/26/1960  Unsuccessful outbound call made today to assist with:   financial and counseling.  Outreach Attempt:  1st Attempt  A HIPAA compliant voice message was left requesting a return call.  Instructed patient to call back at [phone].  Kesia Dalto [name]  [facility] Population Health Community Resource Care Guide   [email or web address]  ?? [phone]   Website: [email or web address]  [email or web address]

## 2023-03-23 ENCOUNTER — Telehealth: Payer: Self-pay

## 2023-03-23 NOTE — Telephone Encounter (Signed)
   Telephone encounter was:  Unsuccessful.  03/23/2023 Name: Taylor Larsen MRN: [id number] DOB: February 15, 1961  Unsuccessful outbound call made today to assist with:  Food Insecurity  Outreach Attempt:  2nd Attempt  A HIPAA compliant voice message was left requesting a return call.  Instructed patient to call back .    Lenard Forth [facility] Guide, [facility] [phone] [address] [place], [place], [place] [address] Phone: [phone] Email: [email or web address]

## 2023-03-23 NOTE — Telephone Encounter (Signed)
   Telephone encounter was:  Unsuccessful.  03/23/2023 Name: Taylor Larsen MRN: [id number] DOB: 23-Apr-1961  Unsuccessful outbound call made today to assist with:   financial, counseling.  Outreach Attempt:  2nd Attempt  A HIPAA compliant voice message was left requesting a return call.  Instructed patient to call back at [phone].  [name] [name]  [facility] [place] Population Health Community Resource Care Guide   [name]  ?? [phone]   Website: [email or web address]  [email or web address]

## 2023-03-23 NOTE — Progress Notes (Signed)
  Care Coordination   Note   03/23/2023 Name: Taylor Larsen MRN: [id number] DOB: 02/04/1961  Taylor Larsen is a 62 y.o. year old female who sees Pardue, Monico Blitz, DO for primary care. I reached out to Adrian Prows by phone today to offer care coordination services.  Ms. Rocker was given information about Care Coordination services today including:   The Care Coordination services include support from the care team which includes your Nurse Coordinator, Clinical Social Worker, or Pharmacist.  The Care Coordination team is here to help remove barriers to the health concerns and goals most important to you. Care Coordination services are voluntary, and the patient may decline or stop services at any time by request to their care team member.   Care Coordination Consent Status: Patient agreed to services and verbal consent obtained.   Follow up plan:  Telephone appointment with care coordination team member scheduled for:  03/26/2023  Encounter Outcome:  Pt. Scheduled from referral   Burman Nieves, [facility] Care Coordination Care Guide Direct Dial: [phone]

## 2023-03-26 ENCOUNTER — Telehealth: Payer: Self-pay | Admitting: *Deleted

## 2023-03-26 ENCOUNTER — Telehealth: Payer: Self-pay

## 2023-03-26 ENCOUNTER — Encounter: Payer: Self-pay | Admitting: *Deleted

## 2023-03-26 NOTE — Patient Outreach (Signed)
  Care Coordination   03/26/2023 Name: Taylor Larsen MRN: [id number] DOB: 1960/10/13   Care Coordination Outreach Attempts:  An unsuccessful telephone outreach was attempted for a scheduled appointment today.  Follow Up Plan:  Additional outreach attempts will be made to offer the patient care coordination information and services.   Encounter Outcome:  Pt. Visit Completed   Care Coordination Interventions:  No, not indicated    Ruthellen Tippy, LCSW Clinical Social Worker  [facility] Care Management [phone]

## 2023-03-26 NOTE — Telephone Encounter (Signed)
   Telephone encounter was:  Unsuccessful.  03/26/2023 Name: Taylor Larsen MRN: [id number] DOB: [birth year]  Unsuccessful outbound call made today to assist with:   financial, counseling.  Outreach Attempt:  3rd Attempt.  Referral closed unable to contact patient.  A HIPAA compliant voice message was left requesting a return call.  Instructed patient to call back at [phone].  Deshon Koslowski [name]  [facility] Population Health Community Resource Care Guide   [email or web address]  ?? [phone]   Website: [email or web address]  [email or web address]

## 2023-04-03 ENCOUNTER — Telehealth: Payer: Self-pay | Admitting: *Deleted

## 2023-04-03 NOTE — Progress Notes (Signed)
  Care Coordination Note  04/03/2023 Name: Taylor Larsen MRN: [id number] DOB: 08-21-1960  Taylor Larsen is a 62 y.o. year old female who is a primary care patient of Pardue, Monico Blitz, DO and is actively engaged with the care management team. I reached out to Adrian Prows by phone today to assist with re-scheduling an initial visit with the Licensed Clinical Social Worker  Follow up plan: We have been unable to make contact with the patient for follow up.   Burman Nieves, CCMA Care Coordination Care Guide Direct Dial: [phone]

## 2023-04-06 ENCOUNTER — Telehealth: Payer: Self-pay

## 2023-04-06 NOTE — Telephone Encounter (Signed)
   Telephone encounter was:  Successful.  04/06/2023 Name: Taylor Larsen MRN: [id number] DOB: 1961/03/08  Taylor Larsen is a 62 y.o. year old female who is a primary care patient of Sherlyn Hay, DO . The community resource team was consulted for assistance with Food Insecurity and utilities, behavioral health.  Care guide performed the following interventions: Patient gave permission to speak to her friend Merlinda Frederick [phone]) about contact information for [employer], LCSW. Patient missed her appointment. Verified home address to mail resources for food, utility and behavioral health.  Follow Up Plan:  No further follow up planned at this time. The patient has been provided with needed resources.  Reyansh Kushnir [name]  [facility] Population Health Community Resource Care Guide   [email or web address]  ?? [phone]   Website: [email or web address]  [email or web address]

## 2023-04-16 ENCOUNTER — Encounter: Payer: Self-pay | Admitting: Family Medicine

## 2023-04-16 ENCOUNTER — Ambulatory Visit (INDEPENDENT_AMBULATORY_CARE_PROVIDER_SITE_OTHER): Payer: 59 | Admitting: Family Medicine

## 2023-04-16 ENCOUNTER — Ambulatory Visit: Payer: Self-pay | Admitting: *Deleted

## 2023-04-16 VITALS — BP 162/70 | HR 67 | Temp 98.0°F | Ht 59.0 in | Wt 95.0 lb

## 2023-04-16 DIAGNOSIS — N3000 Acute cystitis without hematuria: Secondary | ICD-10-CM

## 2023-04-16 DIAGNOSIS — F430 Acute stress reaction: Secondary | ICD-10-CM

## 2023-04-16 DIAGNOSIS — Z1211 Encounter for screening for malignant neoplasm of colon: Secondary | ICD-10-CM | POA: Diagnosis not present

## 2023-04-16 DIAGNOSIS — F411 Generalized anxiety disorder: Secondary | ICD-10-CM

## 2023-04-16 DIAGNOSIS — Z1231 Encounter for screening mammogram for malignant neoplasm of breast: Secondary | ICD-10-CM

## 2023-04-16 DIAGNOSIS — F410 Panic disorder [episodic paroxysmal anxiety] without agoraphobia: Secondary | ICD-10-CM | POA: Diagnosis not present

## 2023-04-16 DIAGNOSIS — Z1212 Encounter for screening for malignant neoplasm of rectum: Secondary | ICD-10-CM | POA: Diagnosis not present

## 2023-04-16 DIAGNOSIS — Z23 Encounter for immunization: Secondary | ICD-10-CM | POA: Diagnosis not present

## 2023-04-16 DIAGNOSIS — I10 Essential (primary) hypertension: Secondary | ICD-10-CM | POA: Diagnosis not present

## 2023-04-16 DIAGNOSIS — N898 Other specified noninflammatory disorders of vagina: Secondary | ICD-10-CM

## 2023-04-16 DIAGNOSIS — R399 Unspecified symptoms and signs involving the genitourinary system: Secondary | ICD-10-CM | POA: Diagnosis not present

## 2023-04-16 DIAGNOSIS — R195 Other fecal abnormalities: Secondary | ICD-10-CM | POA: Diagnosis not present

## 2023-04-16 LAB — POCT URINALYSIS DIPSTICK
Bilirubin, UA: NEGATIVE
Blood, UA: NEGATIVE
Glucose, UA: NEGATIVE
Ketones, UA: NEGATIVE
Nitrite, UA: NEGATIVE
Protein, UA: NEGATIVE
Spec Grav, UA: 1.025 (ref 1.010–1.025)
Urobilinogen, UA: 0.2 U/dL
pH, UA: 6.5 (ref 5.0–8.0)

## 2023-04-16 MED ORDER — VALSARTAN-HYDROCHLOROTHIAZIDE 160-12.5 MG PO TABS
1.0000 | ORAL_TABLET | Freq: Every day | ORAL | 1 refills | Status: AC
Start: 2023-04-16 — End: ?

## 2023-04-16 MED ORDER — DIAZEPAM 5 MG PO TABS: 5.0000 mg | ORAL_TABLET | Freq: Four times a day (QID) | ORAL | 0 refills | Status: AC | PRN

## 2023-04-16 MED ORDER — QUETIAPINE FUMARATE 100 MG PO TABS
100.0000 mg | ORAL_TABLET | Freq: Every day | ORAL | 0 refills | Status: AC
Start: 2023-04-16 — End: ?

## 2023-04-16 NOTE — Patient Instructions (Signed)
Visit Information  Thank you for taking time to visit with me today. Please don't hesitate to contact me if I can be of assistance to you.   Following are the goals we discussed today:  CSW to complete referral for [employer] [facility] CSW to provided resources for rental assistance   Our next appointment is by telephone on 04/23/23 at 10am  Please call the care guide team at [phone] if you need to cancel or reschedule your appointment.   If you are experiencing a Mental Health or Behavioral Health Crisis or need someone to talk to, please call the Suicide and Crisis Lifeline: 988   The patient verbalized understanding of instructions, educational materials, and care plan provided today and DECLINED offer to receive copy of patient instructions, educational materials, and care plan.   Telephone follow up appointment with care management team member scheduled for: 04/23/23   Verna Czech, LCSW [place]  [facility] Licensed Clinical Social Worker Care Coordinator  Direct Dial: [phone]
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
## 2023-04-16 NOTE — Patient Instructions (Signed)
Hand surgeon: Fredderick Erb [address] [place], [place] [address] Phone: [phone]
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
## 2023-04-17 ENCOUNTER — Telehealth: Payer: Self-pay

## 2023-04-17 LAB — URINALYSIS, MICROSCOPIC ONLY
Casts: NONE SEEN /LPF
RBC, Urine: NONE SEEN /HPF (ref 0–2)

## 2023-04-17 NOTE — Telephone Encounter (Signed)
LMTCB-Ok for [facility] Nurse to give patient providers message

## 2023-04-17 NOTE — Telephone Encounter (Signed)
Copied from CRM [id number]. Topic: General - Other >> Apr 17, 2023 11:06 AM Macon Large wrote: Reason for CRM: Tamika with [facility] reports that the liquid spilled in the bag so they are requesting that the sample be re-collected. Cb# [phone]

## 2023-04-18 DIAGNOSIS — R195 Other fecal abnormalities: Secondary | ICD-10-CM | POA: Insufficient documentation

## 2023-04-18 MED ORDER — SULFAMETHOXAZOLE-TRIMETHOPRIM 800-160 MG PO TABS
1.0000 | ORAL_TABLET | Freq: Two times a day (BID) | ORAL | 0 refills | Status: AC
Start: 2023-04-18 — End: ?

## 2023-04-18 NOTE — Assessment & Plan Note (Addendum)
Continues to experience anxiety.  Objectively, her GAD-7 has improved significantly; however, patient reports that her anxiety seems worse than the previous visit. Will go ahead and increase quetiapine to 100 mg daily at bedtime Refilled patient's diazepam today but counseled her extensively on the importance of reducing her dependence on this medication for managing her anxiety.  - Will continue to taper down the dosage as we gain better control of her symptoms with alternative medications

## 2023-04-18 NOTE — Assessment & Plan Note (Signed)
Patient's blood pressure remains very elevated. Increasing valsartan today; patient will start valsartan/hydrochlorothiazide 160-12.5 mg and discontinue the 80-12.5 mg dosage.

## 2023-04-19 ENCOUNTER — Encounter: Payer: Self-pay | Admitting: *Deleted

## 2023-04-19 LAB — URINE CULTURE

## 2023-04-19 NOTE — Patient Outreach (Signed)
  Care Coordination   Collaboration  Visit Note   04/19/2023 Name: Taylor Larsen MRN: [id number] DOB: 03/13/1961  Taylor Larsen is a 62 y.o. year old female who sees Pardue, Monico Blitz, DO for primary care. I  completed referral to the [facility] platform for options related to rental assistance and advocacy  What matters to the patients health and wellness today?  Rental assistance options and community resource supports    Goals Addressed             This Visit's Progress    care coordination actvities       Interventions Today    Flowsheet Row Most Recent Value  General Interventions   General Interventions Discussed/Reviewed Communication with  Communication with --  [[email or web address] referral completed for rental assistance options and advocacy due to pending eviction]              SDOH assessments and interventions completed:  No     Care Coordination Interventions:  Yes, provided   Follow up plan: Follow up call scheduled for 04/23/23    Encounter Outcome:  Patient Visit Completed

## 2023-04-19 NOTE — Telephone Encounter (Signed)
Called patient. LMTCB she also has UC results under labs. If she calls back ok for [facility] nurse to give message and provide with results.

## 2023-04-23 ENCOUNTER — Ambulatory Visit: Payer: Self-pay | Admitting: *Deleted

## 2023-04-23 NOTE — Patient Outreach (Signed)
Care Coordination   04/23/2023 Name: Taylor Larsen MRN: [id number] DOB: [birth year]   Care Coordination Outreach Attempts:  An unsuccessful telephone outreach was attempted for a scheduled appointment today.  Follow Up Plan:  Additional outreach attempts will be made to offer the patient care coordination information and services.   Encounter Outcome:  No Answer   Care Coordination Interventions:  No, not indicated    Marcelus Dubberly, LCSW [place]  [facility], [facility] Licensed Clinical Social Worker Care Coordinator  Direct Dial: [phone]

## 2023-05-01 ENCOUNTER — Other Ambulatory Visit: Payer: Self-pay | Admitting: Family Medicine

## 2023-05-01 DIAGNOSIS — N3000 Acute cystitis without hematuria: Secondary | ICD-10-CM

## 2023-05-15 ENCOUNTER — Telehealth: Payer: Self-pay | Admitting: Family Medicine

## 2023-05-15 NOTE — Telephone Encounter (Signed)
LVM on cell phone for pt to reschedule appt. provider out sick..Home phone not in service

## 2023-05-16 ENCOUNTER — Ambulatory Visit: Payer: 59 | Admitting: Family Medicine
